# Patient Record
Sex: Male | Born: 1940 | Race: White | Hispanic: No | Marital: Married | Smoking: Never smoker
Health system: Southern US, Community
[De-identification: ages and names within clinical notes are randomized; demographics above are authoritative.]

## PROBLEM LIST (undated history)

## (undated) DIAGNOSIS — R0602 Shortness of breath: Secondary | ICD-10-CM

## (undated) DIAGNOSIS — R5383 Other fatigue: Secondary | ICD-10-CM

## (undated) DIAGNOSIS — E7800 Pure hypercholesterolemia, unspecified: Secondary | ICD-10-CM

## (undated) DIAGNOSIS — I10 Essential (primary) hypertension: Secondary | ICD-10-CM

## (undated) HISTORY — DX: Pure hypercholesterolemia, unspecified: E78.00

## (undated) HISTORY — PX: CHOLECYSTECTOMY: SHX55

## (undated) HISTORY — DX: Other fatigue: R53.83

## (undated) HISTORY — PX: REPLACEMENT TOTAL KNEE: SUR1224

## (undated) HISTORY — DX: Essential (primary) hypertension: I10

## (undated) HISTORY — DX: Shortness of breath: R06.02

## (undated) SURGERY — EGD (ESOPHAGOGASTRODUODENOSCOPY)
Anesthesia: Monitor Anesthesia Care

---

## 2015-06-09 DIAGNOSIS — E539 Vitamin B deficiency, unspecified: Secondary | ICD-10-CM | POA: Diagnosis not present

## 2015-06-09 DIAGNOSIS — E782 Mixed hyperlipidemia: Secondary | ICD-10-CM | POA: Diagnosis not present

## 2015-06-09 DIAGNOSIS — I10 Essential (primary) hypertension: Secondary | ICD-10-CM | POA: Diagnosis not present

## 2015-06-09 DIAGNOSIS — E559 Vitamin D deficiency, unspecified: Secondary | ICD-10-CM | POA: Diagnosis not present

## 2015-06-09 DIAGNOSIS — R7301 Impaired fasting glucose: Secondary | ICD-10-CM | POA: Diagnosis not present

## 2015-06-09 DIAGNOSIS — K219 Gastro-esophageal reflux disease without esophagitis: Secondary | ICD-10-CM | POA: Diagnosis not present

## 2015-06-17 DIAGNOSIS — N400 Enlarged prostate without lower urinary tract symptoms: Secondary | ICD-10-CM | POA: Diagnosis not present

## 2015-06-17 DIAGNOSIS — M1712 Unilateral primary osteoarthritis, left knee: Secondary | ICD-10-CM | POA: Diagnosis not present

## 2015-06-17 DIAGNOSIS — E782 Mixed hyperlipidemia: Secondary | ICD-10-CM | POA: Diagnosis not present

## 2015-06-17 DIAGNOSIS — E539 Vitamin B deficiency, unspecified: Secondary | ICD-10-CM | POA: Diagnosis not present

## 2015-06-17 DIAGNOSIS — J3089 Other allergic rhinitis: Secondary | ICD-10-CM | POA: Diagnosis not present

## 2015-06-17 DIAGNOSIS — Z6825 Body mass index (BMI) 25.0-25.9, adult: Secondary | ICD-10-CM | POA: Diagnosis not present

## 2015-06-17 DIAGNOSIS — K219 Gastro-esophageal reflux disease without esophagitis: Secondary | ICD-10-CM | POA: Diagnosis not present

## 2015-06-17 DIAGNOSIS — I10 Essential (primary) hypertension: Secondary | ICD-10-CM | POA: Diagnosis not present

## 2015-08-13 DIAGNOSIS — E539 Vitamin B deficiency, unspecified: Secondary | ICD-10-CM | POA: Diagnosis not present

## 2015-09-03 DIAGNOSIS — R972 Elevated prostate specific antigen [PSA]: Secondary | ICD-10-CM | POA: Diagnosis not present

## 2015-09-10 DIAGNOSIS — R39198 Other difficulties with micturition: Secondary | ICD-10-CM | POA: Diagnosis not present

## 2015-09-10 DIAGNOSIS — N5202 Corporo-venous occlusive erectile dysfunction: Secondary | ICD-10-CM | POA: Diagnosis not present

## 2015-10-06 DIAGNOSIS — L570 Actinic keratosis: Secondary | ICD-10-CM | POA: Diagnosis not present

## 2015-10-06 DIAGNOSIS — Z85828 Personal history of other malignant neoplasm of skin: Secondary | ICD-10-CM | POA: Diagnosis not present

## 2015-10-12 DIAGNOSIS — R112 Nausea with vomiting, unspecified: Secondary | ICD-10-CM | POA: Diagnosis not present

## 2015-10-12 DIAGNOSIS — E782 Mixed hyperlipidemia: Secondary | ICD-10-CM | POA: Diagnosis not present

## 2015-10-12 DIAGNOSIS — E539 Vitamin B deficiency, unspecified: Secondary | ICD-10-CM | POA: Diagnosis not present

## 2015-10-12 DIAGNOSIS — Z6825 Body mass index (BMI) 25.0-25.9, adult: Secondary | ICD-10-CM | POA: Diagnosis not present

## 2015-10-12 DIAGNOSIS — K219 Gastro-esophageal reflux disease without esophagitis: Secondary | ICD-10-CM | POA: Diagnosis not present

## 2015-10-12 DIAGNOSIS — I10 Essential (primary) hypertension: Secondary | ICD-10-CM | POA: Diagnosis not present

## 2015-10-12 DIAGNOSIS — R1013 Epigastric pain: Secondary | ICD-10-CM | POA: Diagnosis not present

## 2015-10-19 ENCOUNTER — Encounter (INDEPENDENT_AMBULATORY_CARE_PROVIDER_SITE_OTHER): Payer: Self-pay | Admitting: Internal Medicine

## 2015-10-28 ENCOUNTER — Encounter (INDEPENDENT_AMBULATORY_CARE_PROVIDER_SITE_OTHER): Payer: Self-pay | Admitting: Internal Medicine

## 2015-10-28 ENCOUNTER — Ambulatory Visit (INDEPENDENT_AMBULATORY_CARE_PROVIDER_SITE_OTHER): Payer: Self-pay | Admitting: Internal Medicine

## 2015-10-28 VITALS — BP 152/64 | HR 64 | Temp 97.7°F | Ht 73.0 in | Wt 190.3 lb

## 2015-10-28 DIAGNOSIS — E7800 Pure hypercholesterolemia, unspecified: Secondary | ICD-10-CM | POA: Insufficient documentation

## 2015-10-28 DIAGNOSIS — K219 Gastro-esophageal reflux disease without esophagitis: Secondary | ICD-10-CM

## 2015-10-28 DIAGNOSIS — I10 Essential (primary) hypertension: Secondary | ICD-10-CM | POA: Insufficient documentation

## 2015-10-28 DIAGNOSIS — R1310 Dysphagia, unspecified: Secondary | ICD-10-CM

## 2015-10-28 DIAGNOSIS — Z86010 Personal history of colonic polyps: Secondary | ICD-10-CM

## 2015-10-28 NOTE — Patient Instructions (Addendum)
EGD. The risks and benefits such as perforation, bleeding, and infection were reviewed with the patient and is agreeable. 

## 2015-10-28 NOTE — Progress Notes (Addendum)
   Subjective:    Patient ID: Travis Page, male    DOB: 11/17/1940, 75 y.o.   MRN: [id number]  HPI  Referred by Dr. Pleas Koch for epigastric pain, nausea and vomiting.   Healso tells me he has a colonoscopy every 3 yrs. His last colonoscopy was in 2014 with 3 polyps and all were tubular adenomas. He tells me he is having acid reflux "real bad". He says sometimes pills will lodge in his esophagus. He occasionally has food to lodge. He says he coughs up white mucous while he is eating. He says the Protonix helps sometimes. He says for the last 3 days his epigastric region rumbles. His appetite is good. He thinks he may have lost about 5 pounds. He c/o epigastric and slightly rt abdominal pain. He has had the pain about 3 days.  He says everything sours on his stomach. No NSAIDs except for a Baby ASA.  12/31/2012 Colonoscopy Dr. Truddie Hidden. with polypectomy cecum and hot snare polypectomy x 2 ascending colon:  No colonic neoplasms noted. Diverticula scattered in the sigmoid colon. Sessile polyp in cecum, ascending colon x 2 Biopsy: All polyps were tubular adenomas.      Review of Systems Past Medical History:  Diagnosis Date  . High cholesterol   . Hypertension     Past Surgical History:  Procedure Laterality Date  . CHOLECYSTECTOMY    . REPLACEMENT TOTAL KNEE     rt knee     No Known Allergies  No current outpatient prescriptions on file prior to visit.   No current facility-administered medications on file prior to visit.        Objective:   Physical Exam Blood pressure (!) 152/64, pulse 64, temperature 97.7 F (36.5 C), height 6\' 1"  (1.854 m), weight 190 lb 4.8 oz (86.3 kg).  Alert and oriented. Skin warm and dry. Oral mucosa is moist.   . Sclera anicteric, conjunctivae is pink. Thyroid not enlarged. No cervical lymphadenopathy. Lungs clear. Heart regular rate and rhythm.  Abdomen is soft. Bowel sounds are positive. No hepatomegaly. No abdominal masses felt. Epigastric  tenderness.  No edema to lower extremities.        Assessment & Plan:  GERD, not controlled at this time. Will try Dexilant and see how he does.  EGD/ED. Will esophagram on him and then scheduled EGD/ED.Marland Kitchen Colon polyps.  (tubular adenoma). Report says colonoscopy every 3 years.  Will discuss with Dr. Laural Golden if he needs a colonoscopy.

## 2015-10-29 ENCOUNTER — Encounter (INDEPENDENT_AMBULATORY_CARE_PROVIDER_SITE_OTHER): Payer: Self-pay

## 2015-11-02 DIAGNOSIS — R1310 Dysphagia, unspecified: Secondary | ICD-10-CM | POA: Diagnosis not present

## 2015-11-02 DIAGNOSIS — K224 Dyskinesia of esophagus: Secondary | ICD-10-CM | POA: Diagnosis not present

## 2015-11-02 DIAGNOSIS — K222 Esophageal obstruction: Secondary | ICD-10-CM | POA: Diagnosis not present

## 2015-11-04 ENCOUNTER — Telehealth (INDEPENDENT_AMBULATORY_CARE_PROVIDER_SITE_OTHER): Payer: Self-pay | Admitting: Internal Medicine

## 2015-11-04 ENCOUNTER — Encounter (INDEPENDENT_AMBULATORY_CARE_PROVIDER_SITE_OTHER): Payer: Self-pay

## 2015-11-04 ENCOUNTER — Encounter (INDEPENDENT_AMBULATORY_CARE_PROVIDER_SITE_OTHER): Payer: Self-pay | Admitting: *Deleted

## 2015-11-04 ENCOUNTER — Other Ambulatory Visit (INDEPENDENT_AMBULATORY_CARE_PROVIDER_SITE_OTHER): Payer: Self-pay | Admitting: Internal Medicine

## 2015-11-04 DIAGNOSIS — R1310 Dysphagia, unspecified: Secondary | ICD-10-CM | POA: Insufficient documentation

## 2015-11-04 NOTE — Telephone Encounter (Signed)
EGD/ED sch'd 12/10/15 at 300, patient aware, instructions mailed

## 2015-11-04 NOTE — Telephone Encounter (Signed)
Ann, EGD/ED. I have spoken with patient 

## 2015-11-04 NOTE — Telephone Encounter (Signed)
I have spoken with patient. His last colonoscopy was in 2014: tubular adenoma. All were small. Next colonoscopy in 2019. I spoke with patient and discussed with Dr. Laural Golden.

## 2015-12-10 ENCOUNTER — Encounter (HOSPITAL_COMMUNITY): Payer: Self-pay | Admitting: *Deleted

## 2015-12-10 ENCOUNTER — Ambulatory Visit (HOSPITAL_COMMUNITY)
Admission: RE | Admit: 2015-12-10 | Discharge: 2015-12-10 | Disposition: A | Discharge status: Home Health | Payer: Medicare Other | Source: Ambulatory Visit | Attending: Internal Medicine | Admitting: Internal Medicine

## 2015-12-10 ENCOUNTER — Encounter (HOSPITAL_COMMUNITY)
Admission: RE | Disposition: A | Discharge status: Home Health | Payer: Self-pay | Source: Ambulatory Visit | Attending: Internal Medicine

## 2015-12-10 DIAGNOSIS — K222 Esophageal obstruction: Secondary | ICD-10-CM | POA: Insufficient documentation

## 2015-12-10 DIAGNOSIS — R1310 Dysphagia, unspecified: Secondary | ICD-10-CM | POA: Diagnosis present

## 2015-12-10 DIAGNOSIS — I10 Essential (primary) hypertension: Secondary | ICD-10-CM | POA: Insufficient documentation

## 2015-12-10 DIAGNOSIS — F17290 Nicotine dependence, other tobacco product, uncomplicated: Secondary | ICD-10-CM | POA: Diagnosis not present

## 2015-12-10 DIAGNOSIS — Z79899 Other long term (current) drug therapy: Secondary | ICD-10-CM | POA: Diagnosis not present

## 2015-12-10 DIAGNOSIS — K449 Diaphragmatic hernia without obstruction or gangrene: Secondary | ICD-10-CM | POA: Diagnosis not present

## 2015-12-10 DIAGNOSIS — E7800 Pure hypercholesterolemia, unspecified: Secondary | ICD-10-CM | POA: Insufficient documentation

## 2015-12-10 DIAGNOSIS — Z7982 Long term (current) use of aspirin: Secondary | ICD-10-CM | POA: Insufficient documentation

## 2015-12-10 DIAGNOSIS — Z96651 Presence of right artificial knee joint: Secondary | ICD-10-CM | POA: Diagnosis not present

## 2015-12-10 DIAGNOSIS — R1013 Epigastric pain: Secondary | ICD-10-CM | POA: Diagnosis not present

## 2015-12-10 DIAGNOSIS — R1314 Dysphagia, pharyngoesophageal phase: Secondary | ICD-10-CM | POA: Diagnosis not present

## 2015-12-10 DIAGNOSIS — K210 Gastro-esophageal reflux disease with esophagitis: Secondary | ICD-10-CM | POA: Diagnosis not present

## 2015-12-10 DIAGNOSIS — K219 Gastro-esophageal reflux disease without esophagitis: Secondary | ICD-10-CM | POA: Diagnosis not present

## 2015-12-10 HISTORY — PX: ESOPHAGEAL DILATION: SHX303

## 2015-12-10 HISTORY — PX: ESOPHAGOGASTRODUODENOSCOPY: SHX5428

## 2015-12-10 SURGERY — EGD (ESOPHAGOGASTRODUODENOSCOPY)
Anesthesia: Moderate Sedation

## 2015-12-10 MED ORDER — BUTAMBEN-TETRACAINE-BENZOCAINE 2-2-14 % EX AERO
INHALATION_SPRAY | CUTANEOUS | Status: DC | PRN
  Administered 2015-12-10: 2 via TOPICAL

## 2015-12-10 MED ORDER — STERILE WATER FOR IRRIGATION IR SOLN
Status: DC | PRN
  Administered 2015-12-10: 2.5 mL

## 2015-12-10 MED ORDER — MIDAZOLAM HCL 5 MG/5ML IJ SOLN
INTRAMUSCULAR | Status: AC
  Filled 2015-12-10: qty 10

## 2015-12-10 MED ORDER — MEPERIDINE HCL 50 MG/ML IJ SOLN
INTRAMUSCULAR | Status: DC | PRN
  Administered 2015-12-10 (×2): 25 mg via INTRAVENOUS

## 2015-12-10 MED ORDER — MEPERIDINE HCL 50 MG/ML IJ SOLN
INTRAMUSCULAR | Status: AC
  Filled 2015-12-10: qty 1

## 2015-12-10 MED ORDER — SODIUM CHLORIDE 0.9 % IV SOLN
INTRAVENOUS | Status: DC
  Administered 2015-12-10: 15:00:00 via INTRAVENOUS

## 2015-12-10 MED ORDER — MIDAZOLAM HCL 5 MG/5ML IJ SOLN
INTRAMUSCULAR | Status: DC | PRN
  Administered 2015-12-10 (×3): 2 mg via INTRAVENOUS
  Administered 2015-12-10 (×2): 1 mg via INTRAVENOUS

## 2015-12-10 NOTE — Discharge Instructions (Signed)
Resume aspirin on 12/14/2015. Resume other medications as before. Resume usual diet. No driving for 24 hours. Please call office with progress report next week.   Gastrointestinal Endoscopy, Care After Refer to this sheet in the next few weeks. These instructions provide you with information on caring for yourself after your procedure. Your caregiver may also give you more specific instructions. Your treatment has been planned according to current medical practices, but problems sometimes occur. Call your caregiver if you have any problems or questions after your procedure. HOME CARE INSTRUCTIONS  If you were given medicine to help you relax (sedative), do not drive, operate machinery, or sign important documents for 24 hours.  Avoid alcohol and hot or warm beverages for the first 24 hours after the procedure.  Only take over-the-counter or prescription medicines for pain, discomfort, or fever as directed by your caregiver. You may resume taking your normal medicines unless your caregiver tells you otherwise. Ask your caregiver when you may resume taking medicines that may cause bleeding, such as aspirin, clopidogrel, or warfarin.  You may return to your normal diet and activities on the day after your procedure, or as directed by your caregiver. Walking may help to reduce any bloated feeling in your abdomen.  Drink enough fluids to keep your urine clear or pale yellow.  You may gargle with salt water if you have a sore throat. SEEK IMMEDIATE MEDICAL CARE IF:  You have severe nausea or vomiting.  You have severe abdominal pain, abdominal cramps that last longer than 6 hours, or abdominal swelling (distention).  You have severe shoulder or back pain.  You have trouble swallowing.  You have shortness of breath, your breathing is shallow, or you are breathing faster than normal.  You have a fever or a rapid heartbeat.  You vomit blood or material that looks like coffee  grounds.  You have bloody, black, or tarry stools. MAKE SURE YOU:  Understand these instructions.  Will watch your condition.  Will get help right away if you are not doing well or get worse.   This information is not intended to replace advice given to you by your health care provider. Make sure you discuss any questions you have with your health care provider.

## 2015-12-10 NOTE — H&P (Signed)
Kier Hergenreder is an 75 y.o. male.   Chief Complaint: Patient is here for EGD and ED. HPI: Patient is 75 year old Caucasian male who was history of GERD for about 20 years. He presents with 3-4 year history of dysphagia to solids particularly and bread. He points to suprasternal area side of bolus obstruction. Bolus eventually passes down. He's not had any episode of food impaction. Also complains of intermittent epigastric pain which occurs when he eats certain foods. He denies nausea vomiting melena or frank rectal bleeding. His appetite is good and he has not lost any weight. He notices blood in the tissues sometimes felt to be due to hemorrhoids.  Past Medical History:  Diagnosis Date  . High cholesterol   . Hypertension         GERD.       Right knee pain.  Past Surgical History:  Procedure Laterality Date  . CHOLECYSTECTOMY    . REPLACEMENT TOTAL KNEE     rt knee     History reviewed. No pertinent family history. Social History:  reports that he has never smoked. He uses smokeless tobacco. He reports that he does not drink alcohol or use drugs.  Allergies:  Allergies  Allergen Reactions  . Prednisone     Chest pain. Can take Decadron per records  . Statins     Muscle aches.    Medications Prior to Admission  Medication Sig Dispense Refill  . dutasteride (AVODART) 0.5 MG capsule Take 0.5 mg by mouth daily.    . fenofibrate 160 MG tablet Take 160 mg by mouth daily.    Marland Kitchen HYDROcodone-acetaminophen (NORCO) 10-325 MG tablet Take 1 tablet by mouth every 6 (six) hours as needed.    Marland Kitchen losartan-hydrochlorothiazide (HYZAAR) 50-12.5 MG tablet Take 1 tablet by mouth daily.    . pantoprazole (PROTONIX) 40 MG tablet Take 40 mg by mouth daily.    Marland Kitchen zolpidem (AMBIEN) 10 MG tablet Take 10 mg by mouth at bedtime.    Marland Kitchen aspirin 81 MG tablet Take 81 mg by mouth daily.      No results found for this or any previous visit (from the past 48 hour(s)). No results found.  ROS  Blood pressure (!)  152/66, pulse (!) 46, temperature 98.4 F (36.9 C), temperature source Oral, resp. rate (!) 8, height 6\' 1"  (1.854 m), weight 193 lb (87.5 kg), SpO2 95 %. Physical Exam  Constitutional: He appears well-developed and well-nourished.  HENT:  Mouth/Throat: Oropharynx is clear and moist.  Patient is edentulous but has dentures.  Eyes: Conjunctivae are normal. No scleral icterus.  Neck: No thyromegaly present.  Cardiovascular: Normal rate, regular rhythm and normal heart sounds.   No murmur heard. Respiratory: Effort normal and breath sounds normal.  GI: Soft. He exhibits no distension and no mass. There is no tenderness.  Musculoskeletal: He exhibits no edema.  Lymphadenopathy:    He has no cervical adenopathy.  Neurological: He is alert.  Skin: Skin is warm and dry.     Assessment/Plan Solid food dysphagia in patient with chronic GERD. Epigastric pain. EGD with ED.  Hildred Laser, MD 12/10/2015, 3:26 PM

## 2015-12-10 NOTE — Op Note (Addendum)
[facility] Patient Name: Travis Page Procedure Date: 12/10/2015 3:25 PM MRN: [id number] Date of Birth: 09/07/1940 Attending MD: Hildred Laser , MD CSN: [id number] Age: 75 Admit Type: Outpatient Procedure:                Upper GI endoscopy Indications:              Esophageal dysphagia, Reflux esophagitis Providers:                Hildred Laser, MD, Lurline Del, RN, Purcell Nails. [place],                            Technician Referring MD:             Curlene Labrum, MD Medicines:                Cetacaine spray, Meperidine 50 mg IV, Midazolam 8                            mg IV Complications:            No immediate complications. Estimated Blood Loss:     Estimated blood loss was minimal. Procedure:                Pre-Anesthesia Assessment:                           - Prior to the procedure, a History and Physical                            was performed, and patient medications and                            allergies were reviewed. The patient's tolerance of                            previous anesthesia was also reviewed. The risks                            and benefits of the procedure and the sedation                            options and risks were discussed with the patient.                            All questions were answered, and informed consent                            was obtained. Prior Anticoagulants: The patient                            last took aspirin 3 days prior to the procedure.                            ASA Grade Assessment: II - A patient with mild  systemic disease. After reviewing the risks and                            benefits, the patient was deemed in satisfactory                            condition to undergo the procedure.                           After obtaining informed consent, the endoscope was                            passed under direct vision. Throughout the                            procedure, the  patient's blood pressure, pulse, and                            oxygen saturations were monitored continuously. The                            [id number]) scope was introduced through the                            mouth, and advanced to the second part of duodenum.                            The [id number]) scope was introduced through                            the mouth, and advanced to the. The upper GI                            endoscopy was technically difficult and complex due                            to unusual anatomy. Successful completion of the                            procedure was aided by withdrawing the scope and                            replacing with the pediatric endoscope. The patient                            tolerated the procedure well. Scope In: 3:36:51 PM Scope Out: 4:23:38 PM Total Procedure Duration: 0 hours 46 minutes 47 seconds  Findings:      A moderate Schatzki ring (acquired) was found at the gastroesophageal       junction. The scope was withdrawn. Dilation was performed with a Maloney       dilator with no resistance at 48 Fr. The dilation site was examined       mucosal disruption and showed moderate improvement in luminal narrowing.  The Z-line was found 38 cm from the incisors.      A 2 cm hiatal hernia was present.      The entire examined stomach was normal.      The duodenal bulb and second portion of the duodenum were normal. Impression:               - Moderate Schatzki ring. Dilated n disrupted by                            passing 54 French Maloney dilator.                           - Z-line, 38 cm from the incisors.                           - 2 cm hiatal hernia.                           - Normal stomach.                           - Normal duodenal bulb and second portion of the                            duodenum.                           - No specimens collected.                           - Comment: long  procedure time because of equipment                            malfunction. Moderate Sedation:      Moderate (conscious) sedation was administered by the endoscopy nurse       and supervised by the endoscopist. The following parameters were       monitored: oxygen saturation, heart rate, blood pressure, CO2       capnography and response to care. Total physician intraservice time was       47 minutes. Recommendation:           - Patient has a contact number available for                            emergencies. The signs and symptoms of potential                            delayed complications were discussed with the                            patient. Return to normal activities tomorrow.                            Written discharge instructions were provided to the  patient.                           - Patient has a contact number available for                            emergencies. The signs and symptoms of potential                            delayed complications were discussed with the                            patient. Return to normal activities tomorrow.                            Written discharge instructions were provided to the                            patient.                           - Resume previous diet today.                           - Continue present medications.                           - Resume aspirin at prior dose 12/14/2015.                           - Telephone GI clinic next week for progress report. Procedure Code(s):        --- Professional ---                           [phone], Esophagogastroduodenoscopy, flexible,                            transoral; diagnostic, including collection of                            specimen(s) by brushing or washing, when performed                            (separate procedure)                           43450, Dilation of esophagus, by unguided sound or                            bougie,  single or multiple passes                           99152, Moderate sedation services provided by the                            same physician or other qualified health care  professional performing the diagnostic or                            therapeutic service that the sedation supports,                            requiring the presence of an independent trained                            observer to assist in the monitoring of the                            patient's level of consciousness and physiological                            status; initial 15 minutes of intraservice time,                            patient age 65 years or older                           [phone], Moderate sedation services; each additional                            15 minutes intraservice time                           [phone], Moderate sedation services; each additional                            15 minutes intraservice time Diagnosis Code(s):        --- Professional ---                           K22.2, Esophageal obstruction                           K44.9, Diaphragmatic hernia without obstruction or                            gangrene                           R13.14, Dysphagia, pharyngoesophageal phase                           K21.0, Gastro-esophageal reflux disease with                            esophagitis CPT copyright 2016 [employer]. All rights reserved. The codes documented in this report are preliminary and upon coder review may  be revised to meet current compliance requirements. Hildred Laser, MD Hildred Laser, MD 12/10/2015 4:41:12 PM This report has been signed electronically. Number of Addenda: 0

## 2015-12-21 ENCOUNTER — Encounter (HOSPITAL_COMMUNITY): Payer: Self-pay | Admitting: Internal Medicine

## 2015-12-25 DIAGNOSIS — Z0001 Encounter for general adult medical examination with abnormal findings: Secondary | ICD-10-CM | POA: Diagnosis not present

## 2015-12-28 DIAGNOSIS — H524 Presbyopia: Secondary | ICD-10-CM | POA: Diagnosis not present

## 2015-12-28 DIAGNOSIS — Z961 Presence of intraocular lens: Secondary | ICD-10-CM | POA: Diagnosis not present

## 2015-12-29 DIAGNOSIS — I10 Essential (primary) hypertension: Secondary | ICD-10-CM | POA: Diagnosis not present

## 2015-12-29 DIAGNOSIS — Z0001 Encounter for general adult medical examination with abnormal findings: Secondary | ICD-10-CM | POA: Diagnosis not present

## 2015-12-29 DIAGNOSIS — Z6826 Body mass index (BMI) 26.0-26.9, adult: Secondary | ICD-10-CM | POA: Diagnosis not present

## 2015-12-29 DIAGNOSIS — N400 Enlarged prostate without lower urinary tract symptoms: Secondary | ICD-10-CM | POA: Diagnosis not present

## 2015-12-29 DIAGNOSIS — K219 Gastro-esophageal reflux disease without esophagitis: Secondary | ICD-10-CM | POA: Diagnosis not present

## 2015-12-29 DIAGNOSIS — D519 Vitamin B12 deficiency anemia, unspecified: Secondary | ICD-10-CM | POA: Diagnosis not present

## 2015-12-29 DIAGNOSIS — Z23 Encounter for immunization: Secondary | ICD-10-CM | POA: Diagnosis not present

## 2015-12-29 DIAGNOSIS — E782 Mixed hyperlipidemia: Secondary | ICD-10-CM | POA: Diagnosis not present

## 2016-02-04 DIAGNOSIS — E539 Vitamin B deficiency, unspecified: Secondary | ICD-10-CM | POA: Diagnosis not present

## 2016-02-18 DIAGNOSIS — Z6827 Body mass index (BMI) 27.0-27.9, adult: Secondary | ICD-10-CM | POA: Diagnosis not present

## 2016-02-18 DIAGNOSIS — J209 Acute bronchitis, unspecified: Secondary | ICD-10-CM | POA: Diagnosis not present

## 2016-02-18 DIAGNOSIS — J3089 Other allergic rhinitis: Secondary | ICD-10-CM | POA: Diagnosis not present

## 2016-02-18 DIAGNOSIS — I10 Essential (primary) hypertension: Secondary | ICD-10-CM | POA: Diagnosis not present

## 2016-02-18 DIAGNOSIS — R5381 Other malaise: Secondary | ICD-10-CM | POA: Diagnosis not present

## 2016-03-07 DIAGNOSIS — E539 Vitamin B deficiency, unspecified: Secondary | ICD-10-CM | POA: Diagnosis not present

## 2016-04-08 DIAGNOSIS — D519 Vitamin B12 deficiency anemia, unspecified: Secondary | ICD-10-CM | POA: Diagnosis not present

## 2016-06-02 DIAGNOSIS — E539 Vitamin B deficiency, unspecified: Secondary | ICD-10-CM | POA: Diagnosis not present

## 2016-06-16 DIAGNOSIS — Z6827 Body mass index (BMI) 27.0-27.9, adult: Secondary | ICD-10-CM | POA: Diagnosis not present

## 2016-06-16 DIAGNOSIS — K130 Diseases of lips: Secondary | ICD-10-CM | POA: Diagnosis not present

## 2016-06-16 DIAGNOSIS — J209 Acute bronchitis, unspecified: Secondary | ICD-10-CM | POA: Diagnosis not present

## 2016-06-28 DIAGNOSIS — E782 Mixed hyperlipidemia: Secondary | ICD-10-CM | POA: Diagnosis not present

## 2016-06-28 DIAGNOSIS — I10 Essential (primary) hypertension: Secondary | ICD-10-CM | POA: Diagnosis not present

## 2016-06-30 DIAGNOSIS — I10 Essential (primary) hypertension: Secondary | ICD-10-CM | POA: Diagnosis not present

## 2016-06-30 DIAGNOSIS — J320 Chronic maxillary sinusitis: Secondary | ICD-10-CM | POA: Diagnosis not present

## 2016-06-30 DIAGNOSIS — H8113 Benign paroxysmal vertigo, bilateral: Secondary | ICD-10-CM | POA: Diagnosis not present

## 2016-06-30 DIAGNOSIS — E782 Mixed hyperlipidemia: Secondary | ICD-10-CM | POA: Diagnosis not present

## 2016-07-04 DIAGNOSIS — E539 Vitamin B deficiency, unspecified: Secondary | ICD-10-CM | POA: Diagnosis not present

## 2016-07-04 DIAGNOSIS — J329 Chronic sinusitis, unspecified: Secondary | ICD-10-CM | POA: Diagnosis not present

## 2016-08-03 DIAGNOSIS — E539 Vitamin B deficiency, unspecified: Secondary | ICD-10-CM | POA: Diagnosis not present

## 2016-09-05 DIAGNOSIS — E539 Vitamin B deficiency, unspecified: Secondary | ICD-10-CM | POA: Diagnosis not present

## 2016-10-04 DIAGNOSIS — L570 Actinic keratosis: Secondary | ICD-10-CM | POA: Diagnosis not present

## 2016-10-06 DIAGNOSIS — I10 Essential (primary) hypertension: Secondary | ICD-10-CM | POA: Diagnosis not present

## 2016-10-06 DIAGNOSIS — N644 Mastodynia: Secondary | ICD-10-CM | POA: Diagnosis not present

## 2016-10-06 DIAGNOSIS — D519 Vitamin B12 deficiency anemia, unspecified: Secondary | ICD-10-CM | POA: Diagnosis not present

## 2016-10-06 DIAGNOSIS — Z6826 Body mass index (BMI) 26.0-26.9, adult: Secondary | ICD-10-CM | POA: Diagnosis not present

## 2016-10-12 DIAGNOSIS — R928 Other abnormal and inconclusive findings on diagnostic imaging of breast: Secondary | ICD-10-CM | POA: Diagnosis not present

## 2016-11-07 DIAGNOSIS — E782 Mixed hyperlipidemia: Secondary | ICD-10-CM | POA: Diagnosis not present

## 2016-11-07 DIAGNOSIS — R5381 Other malaise: Secondary | ICD-10-CM | POA: Diagnosis not present

## 2016-11-07 DIAGNOSIS — D519 Vitamin B12 deficiency anemia, unspecified: Secondary | ICD-10-CM | POA: Diagnosis not present

## 2016-11-07 DIAGNOSIS — R0789 Other chest pain: Secondary | ICD-10-CM | POA: Diagnosis not present

## 2016-11-18 DIAGNOSIS — R079 Chest pain, unspecified: Secondary | ICD-10-CM | POA: Diagnosis not present

## 2016-11-30 ENCOUNTER — Telehealth: Payer: Self-pay | Admitting: Cardiology

## 2016-11-30 ENCOUNTER — Encounter: Payer: Self-pay | Admitting: Cardiology

## 2016-11-30 ENCOUNTER — Ambulatory Visit (INDEPENDENT_AMBULATORY_CARE_PROVIDER_SITE_OTHER): Payer: Medicare Other | Admitting: Cardiology

## 2016-11-30 ENCOUNTER — Encounter: Payer: Self-pay | Admitting: *Deleted

## 2016-11-30 VITALS — BP 122/70 | HR 75 | Ht 73.0 in | Wt 201.0 lb

## 2016-11-30 DIAGNOSIS — R9439 Abnormal result of other cardiovascular function study: Secondary | ICD-10-CM | POA: Diagnosis not present

## 2016-11-30 DIAGNOSIS — R0789 Other chest pain: Secondary | ICD-10-CM | POA: Diagnosis not present

## 2016-11-30 MED ORDER — NITROGLYCERIN 0.4 MG SL SUBL: 0.4000 mg | SUBLINGUAL_TABLET | SUBLINGUAL | 3 refills | Status: DC | PRN

## 2016-11-30 NOTE — Patient Instructions (Signed)
Your physician recommends that you schedule a follow-up appointment in: Travis Page recommends that you continue on your current medications as directed. Please refer to the Current Medication list given to you today.  Your physician has requested that you have a cardiac catheterization. Cardiac catheterization is used to diagnose and/or treat various heart conditions. Doctors may recommend this procedure for a number of different reasons. The most common reason is to evaluate chest pain. Chest pain can be a symptom of coronary artery disease (CAD), and cardiac catheterization can show whether plaque is narrowing or blocking your heart's arteries. This procedure is also used to evaluate the valves, as well as measure the blood flow and oxygen levels in different parts of your heart. For further information please visit [email or web address]. Please follow instruction sheet, as given.  Nitroglycerin sublingual tablets What is this medicine? NITROGLYCERIN (nye troe GLI ser in) is a type of vasodilator. It relaxes blood vessels, increasing the blood and oxygen supply to your heart. This medicine is used to relieve chest pain caused by angina. It is also used to prevent chest pain before activities like climbing stairs, going outdoors in cold weather, or sexual activity. This medicine may be used for other purposes; ask your health care provider or pharmacist if you have questions. COMMON BRAND NAME(S): Nitroquick, Nitrostat, Nitrotab What should I tell my health care provider before I take this medicine? They need to know if you have any of these conditions: -anemia -head injury, recent stroke, or bleeding in the brain -liver disease -previous heart attack -an unusual or allergic reaction to nitroglycerin, other medicines, foods, dyes, or preservatives -pregnant or trying to get pregnant -breast-feeding How should I use this medicine? Take this medicine by mouth as needed.  At the first sign of an angina attack (chest pain or tightness) place one tablet under your tongue. You can also take this medicine 5 to 10 minutes before an event likely to produce chest pain. Follow the directions on the prescription label. Let the tablet dissolve under the tongue. Do not swallow whole. Replace the dose if you accidentally swallow it. It will help if your mouth is not dry. Saliva around the tablet will help it to dissolve more quickly. Do not eat or drink, smoke or chew tobacco while a tablet is dissolving. If you are not better within 5 minutes after taking ONE dose of nitroglycerin, call 9-1-1 immediately to seek emergency medical care. Do not take more than 3 nitroglycerin tablets over 15 minutes. If you take this medicine often to relieve symptoms of angina, your doctor or health care professional may provide you with different instructions to manage your symptoms. If symptoms do not go away after following these instructions, it is important to call 9-1-1 immediately. Do not take more than 3 nitroglycerin tablets over 15 minutes. Talk to your pediatrician regarding the use of this medicine in children. Special care may be needed. Overdosage: If you think you have taken too much of this medicine contact a poison control center or emergency room at once. NOTE: This medicine is only for you. Do not share this medicine with others. What if I miss a dose? This does not apply. This medicine is only used as needed. What may interact with this medicine? Do not take this medicine with any of the following medications: -certain migraine medicines like ergotamine and dihydroergotamine (DHE) -medicines used to treat erectile dysfunction like sildenafil, tadalafil, and vardenafil -riociguat This medicine may also  interact with the following medications: -alteplase -aspirin -heparin -medicines for high blood pressure -medicines for mental depression -other medicines used to treat  angina -phenothiazines like chlorpromazine, mesoridazine, prochlorperazine, thioridazine This list may not describe all possible interactions. Give your health care provider a list of all the medicines, herbs, non-prescription drugs, or dietary supplements you use. Also tell them if you smoke, drink alcohol, or use illegal drugs. Some items may interact with your medicine. What should I watch for while using this medicine? Tell your doctor or health care professional if you feel your medicine is no longer working. Keep this medicine with you at all times. Sit or lie down when you take your medicine to prevent falling if you feel dizzy or faint after using it. Try to remain calm. This will help you to feel better faster. If you feel dizzy, take several deep breaths and lie down with your feet propped up, or bend forward with your head resting between your knees. You may get drowsy or dizzy. Do not drive, use machinery, or do anything that needs mental alertness until you know how this drug affects you. Do not stand or sit up quickly, especially if you are an older patient. This reduces the risk of dizzy or fainting spells. Alcohol can make you more drowsy and dizzy. Avoid alcoholic drinks. Do not treat yourself for coughs, colds, or pain while you are taking this medicine without asking your doctor or health care professional for advice. Some ingredients may increase your blood pressure. What side effects may I notice from receiving this medicine? Side effects that you should report to your doctor or health care professional as soon as possible: -blurred vision -dry mouth -skin rash -sweating -the feeling of extreme pressure in the head -unusually weak or tired Side effects that usually do not require medical attention (report to your doctor or health care professional if they continue or are bothersome): -flushing of the face or neck -headache -irregular heartbeat, palpitations -nausea,  vomiting This list may not describe all possible side effects. Call your doctor for medical advice about side effects. You may report side effects to FDA at 1-800-FDA-1088. Where should I keep my medicine? Keep out of the reach of children. Store at room temperature between 20 and 25 degrees C (68 and 77 degrees F). Store in [facility] [employer]. Protect from light and moisture. Keep tightly closed. Throw away any unused medicine after the expiration date. NOTE: This sheet is a summary. It may not cover all possible information. If you have questions about this medicine, talk to your doctor, pharmacist, or health care provider.  2018 Elsevier/Gold Standard (2013-01-03 17:57:36)

## 2016-11-30 NOTE — Progress Notes (Signed)
Clinical Summary Mr. Parekh is a 76 y.o.male seen as a new consult for chest pain, referred by Dr Gae Dry.   1. Chest pain - left sided chest pain sharp pain, lasted about 30 seconds. 10/10 in severity, can occur at rest or with activity. No other associated symptoms. EPisodes occurs about every 2 weeks - can have some SOB/DOE with his yardworks which is new, gets severely diaphoretic - 10/2016 nuclear stress: moderate inferior ischemia, LVEF 54%, intermediate risk   CAD risk factors: HTN, HL, father MI, mother MI, sister with stents and CABG.   Past Medical History:  Diagnosis Date  . Fatigue   . High cholesterol   . Hypertension   . Shortness of breath      Allergies  Allergen Reactions  . Prednisone     Chest pain. Can take Decadron per records  . Statins     Muscle aches.     Current Outpatient Prescriptions  Medication Sig Dispense Refill  . aspirin 81 MG tablet Take 1 tablet (81 mg total) by mouth daily. 30 tablet   . cyclobenzaprine (FLEXERIL) 10 MG tablet Take 10 mg by mouth 3 (three) times daily as needed for muscle spasms.    Marland Kitchen dutasteride (AVODART) 0.5 MG capsule Take 0.5 mg by mouth daily.    . fenofibrate 160 MG tablet Take 160 mg by mouth daily.    Marland Kitchen HYDROcodone-acetaminophen (NORCO) 10-325 MG tablet Take by mouth.    Marland Kitchen HYDROcodone-homatropine (HYCODAN) 5-1.5 MG/5ML syrup Take 5 mLs by mouth every 6 (six) hours as needed for cough.    . losartan (COZAAR) 50 MG tablet Take 50 mg by mouth daily.    . meclizine (ANTIVERT) 25 MG tablet Take 25 mg by mouth 3 (three) times daily as needed for dizziness.    . pantoprazole (PROTONIX) 40 MG tablet Take 40 mg by mouth daily.    . promethazine (PHENERGAN) 25 MG tablet Take 25 mg by mouth every 6 (six) hours as needed for nausea or vomiting.    Marland Kitchen zolpidem (AMBIEN) 10 MG tablet Take 10 mg by mouth at bedtime.     No current facility-administered medications for this visit.      Past Surgical History:    Procedure Laterality Date  . CHOLECYSTECTOMY    . ESOPHAGEAL DILATION N/A 12/10/2015   Procedure: ESOPHAGEAL DILATION;  Surgeon: Rogene Houston, MD;  Location: [facility];  Service: Endoscopy;  Laterality: N/A;  . ESOPHAGOGASTRODUODENOSCOPY N/A 12/10/2015   Procedure: ESOPHAGOGASTRODUODENOSCOPY (EGD);  Surgeon: Rogene Houston, MD;  Location: [facility];  Service: Endoscopy;  Laterality: N/A;  . REPLACEMENT TOTAL KNEE     rt knee      Allergies  Allergen Reactions  . Prednisone     Chest pain. Can take Decadron per records  . Statins     Muscle aches.      Family History  Problem Relation Age of Onset  . Heart attack Mother   . Hypercholesterolemia Mother   . Heart attack Father   . Hypercholesterolemia Sister   . CAD Sister      Social History Mr. Alessio reports that he has never smoked. He uses smokeless tobacco. Mr. Fleming reports that he does not drink alcohol.   Review of Systems CONSTITUTIONAL: No weight loss, fever, chills, weakness or fatigue.  HEENT: Eyes: No visual loss, blurred vision, double vision or yellow sclerae.No hearing loss, sneezing, congestion, runny nose or sore throat.  SKIN: No rash or  itching.  CARDIOVASCULAR: per hpi RESPIRATORY: per hpi GASTROINTESTINAL: No anorexia, nausea, vomiting or diarrhea. No abdominal pain or blood.  GENITOURINARY: No burning on urination, no polyuria NEUROLOGICAL: No headache, dizziness, syncope, paralysis, ataxia, numbness or tingling in the extremities. No change in bowel or bladder control.  MUSCULOSKELETAL: No muscle, back pain, joint pain or stiffness.  LYMPHATICS: No enlarged nodes. No history of splenectomy.  PSYCHIATRIC: No history of depression or anxiety.  ENDOCRINOLOGIC: No reports of sweating, cold or heat intolerance. No polyuria or polydipsia.  Marland Kitchen   Physical Examination Vitals:   11/30/16 1339  BP: 122/70  Pulse: 75  SpO2: 96%   Vitals:   11/30/16 1339  Weight: 201 lb (91.2 kg)   Height: 6\' 1"  (1.854 m)    Gen: resting comfortably, no acute distress HEENT: no scleral icterus, pupils equal round and reactive, no palptable cervical adenopathy,  CV: RRR, no m/r/g, no jvd Resp: Clear to auscultation bilaterally GI: abdomen is soft, non-tender, non-distended, normal bowel sounds, no hepatosplenomegaly MSK: extremities are warm, no edema.  Skin: warm, no rash Neuro:  no focal deficits Psych: appropriate affect     Assessment and Plan   1. Chest pain - recent chest pain symptoms in patient with multiple CAD risk factors and recent abnormal nuclear stress test showing moderate inferior ischemia - we will plan for cardiac cath.  - Given Rx for SL NG   I have reviewed the risks, indications, and alternatives to cardiac catheterization, possible angioplasty, and stenting with the patient  today. Risks include but are not limited to bleeding, infection, vascular injury, stroke, myocardial infection, arrhythmia, kidney injury, radiation-related injury in the case of prolonged fluoroscopy use, emergency cardiac surgery, and death. The patient understands the risks of serious complication is 1-2 in 4656 with diagnostic cardiac cath and 1-2% or less with angioplasty/stenting.   F/u 1 month   Arnoldo Lenis, M.D., F.A.C.C.

## 2016-11-30 NOTE — Telephone Encounter (Signed)
Pre-cert Verification for the following procedure   [facility] 12/07/16 @ 8:30AM VARANASI

## 2016-12-05 ENCOUNTER — Telehealth: Payer: Self-pay

## 2016-12-05 NOTE — Telephone Encounter (Signed)
Spoke with wife per DPR Patient contacted pre-catheterization at [facility] scheduled for:  12/07/2016 @ 0830 Verified arrival time and place:  [facility] @ 0630 Confirmed AM meds to be taken pre-cath with sip of water: Take ASA Hold losartan/hctz Confirmed patient has responsible person to drive home post procedure and observe patient for 24 hours:  yes

## 2016-12-07 ENCOUNTER — Encounter (HOSPITAL_COMMUNITY)
Admission: RE | Disposition: A | Discharge status: Home / Self Care | Payer: Self-pay | Source: Ambulatory Visit | Attending: Interventional Cardiology

## 2016-12-07 ENCOUNTER — Ambulatory Visit (HOSPITAL_COMMUNITY)
Admission: RE | Admit: 2016-12-07 | Discharge: 2016-12-07 | Disposition: A | Discharge status: Home / Self Care | Payer: Medicare Other | Source: Ambulatory Visit | Attending: Interventional Cardiology | Admitting: Interventional Cardiology

## 2016-12-07 DIAGNOSIS — R0789 Other chest pain: Secondary | ICD-10-CM

## 2016-12-07 DIAGNOSIS — I739 Peripheral vascular disease, unspecified: Secondary | ICD-10-CM | POA: Diagnosis not present

## 2016-12-07 DIAGNOSIS — Z888 Allergy status to other drugs, medicaments and biological substances status: Secondary | ICD-10-CM | POA: Diagnosis not present

## 2016-12-07 DIAGNOSIS — R9439 Abnormal result of other cardiovascular function study: Secondary | ICD-10-CM

## 2016-12-07 DIAGNOSIS — R079 Chest pain, unspecified: Secondary | ICD-10-CM | POA: Diagnosis present

## 2016-12-07 DIAGNOSIS — R0602 Shortness of breath: Secondary | ICD-10-CM | POA: Diagnosis not present

## 2016-12-07 DIAGNOSIS — E7800 Pure hypercholesterolemia, unspecified: Secondary | ICD-10-CM | POA: Diagnosis not present

## 2016-12-07 DIAGNOSIS — I2510 Atherosclerotic heart disease of native coronary artery without angina pectoris: Secondary | ICD-10-CM | POA: Insufficient documentation

## 2016-12-07 DIAGNOSIS — Z96652 Presence of left artificial knee joint: Secondary | ICD-10-CM | POA: Insufficient documentation

## 2016-12-07 DIAGNOSIS — Z720 Tobacco use: Secondary | ICD-10-CM | POA: Insufficient documentation

## 2016-12-07 DIAGNOSIS — Z9049 Acquired absence of other specified parts of digestive tract: Secondary | ICD-10-CM | POA: Insufficient documentation

## 2016-12-07 DIAGNOSIS — Z8249 Family history of ischemic heart disease and other diseases of the circulatory system: Secondary | ICD-10-CM | POA: Insufficient documentation

## 2016-12-07 DIAGNOSIS — Z7982 Long term (current) use of aspirin: Secondary | ICD-10-CM | POA: Insufficient documentation

## 2016-12-07 DIAGNOSIS — Z79899 Other long term (current) drug therapy: Secondary | ICD-10-CM | POA: Diagnosis not present

## 2016-12-07 DIAGNOSIS — I10 Essential (primary) hypertension: Secondary | ICD-10-CM | POA: Insufficient documentation

## 2016-12-07 HISTORY — PX: LEFT HEART CATH AND CORONARY ANGIOGRAPHY: CATH118249

## 2016-12-07 LAB — PROTIME-INR
INR: 1.01
PROTHROMBIN TIME: 13.2 s (ref 11.4–15.2)

## 2016-12-07 LAB — BASIC METABOLIC PANEL
Anion gap: 7 (ref 5–15)
BUN: 15 mg/dL (ref 6–20)
CALCIUM: 9.6 mg/dL (ref 8.9–10.3)
CHLORIDE: 105 mmol/L (ref 101–111)
CO2: 25 mmol/L (ref 22–32)
CREATININE: 1.29 mg/dL — AB (ref 0.61–1.24)
GFR, EST NON AFRICAN AMERICAN: 52 mL/min — AB (ref >60)
Glucose, Bld: 121 mg/dL — ABNORMAL HIGH (ref 65–99)
Potassium: 3.7 mmol/L (ref 3.5–5.1)
SODIUM: 137 mmol/L (ref 135–145)

## 2016-12-07 LAB — CBC
HCT: 38.8 % — ABNORMAL LOW (ref 39.0–52.0)
Hemoglobin: 12.9 g/dL — ABNORMAL LOW (ref 13.0–17.0)
MCH: 28.3 pg (ref 26.0–34.0)
MCHC: 33.2 g/dL (ref 30.0–36.0)
MCV: 85.1 fL (ref 78.0–100.0)
PLATELETS: 278 10*3/uL (ref 150–400)
RBC: 4.56 MIL/uL (ref 4.22–5.81)
RDW: 13.7 % (ref 11.5–15.5)
WBC: 5.8 10*3/uL (ref 4.0–10.5)

## 2016-12-07 SURGERY — LEFT HEART CATH AND CORONARY ANGIOGRAPHY
Anesthesia: LOCAL

## 2016-12-07 MED ORDER — SODIUM CHLORIDE 0.9% FLUSH: 3.0000 mL | INTRAVENOUS | Status: DC | PRN

## 2016-12-07 MED ORDER — SODIUM CHLORIDE 0.9 % IV SOLN: INTRAVENOUS | Status: AC

## 2016-12-07 MED ORDER — SODIUM CHLORIDE 0.9% FLUSH: 3.0000 mL | Freq: Two times a day (BID) | INTRAVENOUS | Status: DC

## 2016-12-07 MED ORDER — HEPARIN SODIUM (PORCINE) 1000 UNIT/ML IJ SOLN
INTRAMUSCULAR | Status: DC | PRN
  Administered 2016-12-07: 5000 [IU] via INTRAVENOUS

## 2016-12-07 MED ORDER — HEPARIN SODIUM (PORCINE) 1000 UNIT/ML IJ SOLN
INTRAMUSCULAR | Status: AC
  Filled 2016-12-07: qty 1

## 2016-12-07 MED ORDER — SODIUM CHLORIDE 0.9 % IV SOLN: 250.0000 mL | INTRAVENOUS | Status: DC | PRN

## 2016-12-07 MED ORDER — SODIUM CHLORIDE 0.9 % WEIGHT BASED INFUSION: 1.0000 mL/kg/h | INTRAVENOUS | Status: DC

## 2016-12-07 MED ORDER — HEPARIN (PORCINE) IN NACL 2-0.9 UNIT/ML-% IJ SOLN
INTRAMUSCULAR | Status: AC
  Filled 2016-12-07: qty 1000

## 2016-12-07 MED ORDER — LIDOCAINE HCL (PF) 1 % IJ SOLN
INTRAMUSCULAR | Status: DC | PRN
  Administered 2016-12-07: 2 mL

## 2016-12-07 MED ORDER — VERAPAMIL HCL 2.5 MG/ML IV SOLN
INTRAVENOUS | Status: AC
  Filled 2016-12-07: qty 2

## 2016-12-07 MED ORDER — FENTANYL CITRATE (PF) 100 MCG/2ML IJ SOLN
INTRAMUSCULAR | Status: AC
  Filled 2016-12-07: qty 2

## 2016-12-07 MED ORDER — HEPARIN (PORCINE) IN NACL 2-0.9 UNIT/ML-% IJ SOLN
INTRAMUSCULAR | Status: DC | PRN
  Administered 2016-12-07: 09:00:00

## 2016-12-07 MED ORDER — FENTANYL CITRATE (PF) 100 MCG/2ML IJ SOLN
INTRAMUSCULAR | Status: DC | PRN
  Administered 2016-12-07: 25 ug via INTRAVENOUS

## 2016-12-07 MED ORDER — MIDAZOLAM HCL 2 MG/2ML IJ SOLN
INTRAMUSCULAR | Status: AC
  Filled 2016-12-07: qty 2

## 2016-12-07 MED ORDER — SODIUM CHLORIDE 0.9 % WEIGHT BASED INFUSION
3.0000 mL/kg/h | INTRAVENOUS | Status: AC
Start: 2016-12-07 — End: 2016-12-07
  Administered 2016-12-07: 3 mL/kg/h via INTRAVENOUS

## 2016-12-07 MED ORDER — LIDOCAINE HCL 2 % IJ SOLN
INTRAMUSCULAR | Status: AC
  Filled 2016-12-07: qty 10

## 2016-12-07 MED ORDER — ASPIRIN 81 MG PO CHEW: 81.0000 mg | CHEWABLE_TABLET | ORAL | Status: DC

## 2016-12-07 MED ORDER — IOPAMIDOL (ISOVUE-370) INJECTION 76%
INTRAVENOUS | Status: AC
  Filled 2016-12-07: qty 100

## 2016-12-07 MED ORDER — HEPARIN (PORCINE) IN NACL 2-0.9 UNIT/ML-% IJ SOLN
INTRAMUSCULAR | Status: DC | PRN
  Administered 2016-12-07: 10 mL via INTRA_ARTERIAL

## 2016-12-07 MED ORDER — MIDAZOLAM HCL 2 MG/2ML IJ SOLN
INTRAMUSCULAR | Status: DC | PRN
  Administered 2016-12-07: 2 mg via INTRAVENOUS

## 2016-12-07 MED ORDER — IOPAMIDOL (ISOVUE-370) INJECTION 76%
INTRAVENOUS | Status: DC | PRN
  Administered 2016-12-07: 65 mL via INTRAVENOUS

## 2016-12-07 SURGICAL SUPPLY — 9 items
CATH 5FR JL3.5 JR4 ANG PIG MP (CATHETERS) ×2 IMPLANT
DEVICE RAD COMP TR BAND LRG (VASCULAR PRODUCTS) ×2 IMPLANT
GLIDESHEATH SLEND SS 6F .021 (SHEATH) ×2 IMPLANT
GUIDEWIRE INQWIRE 1.5J.035X260 (WIRE) ×1 IMPLANT
INQWIRE 1.5J .035X260CM (WIRE) ×2
KIT HEART LEFT (KITS) ×2 IMPLANT
PACK CARDIAC CATHETERIZATION (CUSTOM PROCEDURE TRAY) ×2 IMPLANT
TRANSDUCER W/STOPCOCK (MISCELLANEOUS) ×2 IMPLANT
TUBING CIL FLEX 10 FLL-RA (TUBING) ×2 IMPLANT

## 2016-12-07 NOTE — Discharge Instructions (Signed)

## 2016-12-07 NOTE — Interval H&P Note (Signed)
Cath Lab Visit (complete for each Cath Lab visit)  Clinical Evaluation Leading to the Procedure:   ACS: No.  Non-ACS:    Anginal Classification: CCS III  Anti-ischemic medical therapy: Minimal Therapy (1 class of medications)  Non-Invasive Test Results: Intermediate-risk stress test findings: cardiac mortality 1-3%/year  Prior CABG: No previous CABG      History and Physical Interval Note:  12/07/2016 8:30 AM  Travis Page  has presented today for surgery, with the diagnosis of cp - abnormal stress test  The various methods of treatment have been discussed with the patient and family. After consideration of risks, benefits and other options for treatment, the patient has consented to  Procedure(s): LEFT HEART CATH AND CORONARY ANGIOGRAPHY (N/A) as a surgical intervention .  The patient's history has been reviewed, patient examined, no change in status, stable for surgery.  I have reviewed the patient's chart and labs.  Questions were answered to the patient's satisfaction.     Larae Grooms

## 2016-12-07 NOTE — H&P (View-Only) (Signed)
Clinical Summary Travis Page is a 76 y.o.male seen as a new consult for chest pain, referred by Dr Gae Dry.   1. Chest pain - left sided chest pain sharp pain, lasted about 30 seconds. 10/10 in severity, can occur at rest or with activity. No other associated symptoms. EPisodes occurs about every 2 weeks - can have some SOB/DOE with his yardworks which is new, gets severely diaphoretic - 10/2016 nuclear stress: moderate inferior ischemia, LVEF 54%, intermediate risk   CAD risk factors: HTN, HL, father MI, mother MI, sister with stents and CABG.   Past Medical History:  Diagnosis Date  . Fatigue   . High cholesterol   . Hypertension   . Shortness of breath      Allergies  Allergen Reactions  . Prednisone     Chest pain. Can take Decadron per records  . Statins     Muscle aches.     Current Outpatient Prescriptions  Medication Sig Dispense Refill  . aspirin 81 MG tablet Take 1 tablet (81 mg total) by mouth daily. 30 tablet   . cyclobenzaprine (FLEXERIL) 10 MG tablet Take 10 mg by mouth 3 (three) times daily as needed for muscle spasms.    Marland Kitchen dutasteride (AVODART) 0.5 MG capsule Take 0.5 mg by mouth daily.    . fenofibrate 160 MG tablet Take 160 mg by mouth daily.    Marland Kitchen HYDROcodone-acetaminophen (NORCO) 10-325 MG tablet Take by mouth.    Marland Kitchen HYDROcodone-homatropine (HYCODAN) 5-1.5 MG/5ML syrup Take 5 mLs by mouth every 6 (six) hours as needed for cough.    . losartan (COZAAR) 50 MG tablet Take 50 mg by mouth daily.    . meclizine (ANTIVERT) 25 MG tablet Take 25 mg by mouth 3 (three) times daily as needed for dizziness.    . pantoprazole (PROTONIX) 40 MG tablet Take 40 mg by mouth daily.    . promethazine (PHENERGAN) 25 MG tablet Take 25 mg by mouth every 6 (six) hours as needed for nausea or vomiting.    Marland Kitchen zolpidem (AMBIEN) 10 MG tablet Take 10 mg by mouth at bedtime.     No current facility-administered medications for this visit.      Past Surgical History:    Procedure Laterality Date  . CHOLECYSTECTOMY    . ESOPHAGEAL DILATION N/A 12/10/2015   Procedure: ESOPHAGEAL DILATION;  Surgeon: Rogene Houston, MD;  [facility]: [facility];  Service: Endoscopy;  Laterality: N/A;  . ESOPHAGOGASTRODUODENOSCOPY N/A 12/10/2015   Procedure: ESOPHAGOGASTRODUODENOSCOPY (EGD);  Surgeon: Rogene Houston, MD;  [facility]: [facility];  Service: Endoscopy;  Laterality: N/A;  . REPLACEMENT TOTAL KNEE     rt knee      Allergies  Allergen Reactions  . Prednisone     Chest pain. Can take Decadron per records  . Statins     Muscle aches.      Family History  Problem Relation Age of Onset  . Heart attack Mother   . Hypercholesterolemia Mother   . Heart attack Father   . Hypercholesterolemia Sister   . CAD Sister      Social History Travis Page reports that he has never smoked. He uses smokeless tobacco. Travis Page reports that he does not drink alcohol.   Review of Systems CONSTITUTIONAL: No weight loss, fever, chills, weakness or fatigue.  HEENT: Eyes: No visual loss, blurred vision, double vision or yellow sclerae.No hearing loss, sneezing, congestion, runny nose or sore throat.  SKIN: No rash or  itching.  CARDIOVASCULAR: per hpi RESPIRATORY: per hpi GASTROINTESTINAL: No anorexia, nausea, vomiting or diarrhea. No abdominal pain or blood.  GENITOURINARY: No burning on urination, no polyuria NEUROLOGICAL: No headache, dizziness, syncope, paralysis, ataxia, numbness or tingling in the extremities. No change in bowel or bladder control.  MUSCULOSKELETAL: No muscle, back pain, joint pain or stiffness.  LYMPHATICS: No enlarged nodes. No history of splenectomy.  PSYCHIATRIC: No history of depression or anxiety.  ENDOCRINOLOGIC: No reports of sweating, cold or heat intolerance. No polyuria or polydipsia.  Marland Kitchen   Physical Examination Vitals:   11/30/16 1339  BP: 122/70  Pulse: 75  SpO2: 96%   Vitals:   11/30/16 1339  Weight: 201 lb (91.2 kg)   Height: 6\' 1"  (1.854 m)    Gen: resting comfortably, no acute distress HEENT: no scleral icterus, pupils equal round and reactive, no palptable cervical adenopathy,  CV: RRR, no m/r/g, no jvd Resp: Clear to auscultation bilaterally GI: abdomen is soft, non-tender, non-distended, normal bowel sounds, no hepatosplenomegaly MSK: extremities are warm, no edema.  Skin: warm, no rash Neuro:  no focal deficits Psych: appropriate affect     Assessment and Plan   1. Chest pain - recent chest pain symptoms in patient with multiple CAD risk factors and recent abnormal nuclear stress test showing moderate inferior ischemia - we will plan for cardiac cath.  - Given Rx for SL NG   I have reviewed the risks, indications, and alternatives to cardiac catheterization, possible angioplasty, and stenting with the patient  today. Risks include but are not limited to bleeding, infection, vascular injury, stroke, myocardial infection, arrhythmia, kidney injury, radiation-related injury in the case of prolonged fluoroscopy use, emergency cardiac surgery, and death. The patient understands the risks of serious complication is 1-2 in 5329 with diagnostic cardiac cath and 1-2% or less with angioplasty/stenting.   F/u 1 month   Arnoldo Lenis, M.D., F.A.C.C.

## 2016-12-08 ENCOUNTER — Encounter (HOSPITAL_COMMUNITY): Payer: Self-pay | Admitting: Interventional Cardiology

## 2016-12-13 DIAGNOSIS — D519 Vitamin B12 deficiency anemia, unspecified: Secondary | ICD-10-CM | POA: Diagnosis not present

## 2016-12-23 DIAGNOSIS — E539 Vitamin B deficiency, unspecified: Secondary | ICD-10-CM | POA: Diagnosis not present

## 2016-12-23 DIAGNOSIS — E559 Vitamin D deficiency, unspecified: Secondary | ICD-10-CM | POA: Diagnosis not present

## 2016-12-23 DIAGNOSIS — D126 Benign neoplasm of colon, unspecified: Secondary | ICD-10-CM | POA: Diagnosis not present

## 2016-12-23 DIAGNOSIS — E782 Mixed hyperlipidemia: Secondary | ICD-10-CM | POA: Diagnosis not present

## 2016-12-28 DIAGNOSIS — R7301 Impaired fasting glucose: Secondary | ICD-10-CM | POA: Diagnosis not present

## 2016-12-28 DIAGNOSIS — Z0001 Encounter for general adult medical examination with abnormal findings: Secondary | ICD-10-CM | POA: Diagnosis not present

## 2016-12-28 DIAGNOSIS — D126 Benign neoplasm of colon, unspecified: Secondary | ICD-10-CM | POA: Diagnosis not present

## 2016-12-28 DIAGNOSIS — I10 Essential (primary) hypertension: Secondary | ICD-10-CM | POA: Diagnosis not present

## 2017-01-06 ENCOUNTER — Ambulatory Visit (INDEPENDENT_AMBULATORY_CARE_PROVIDER_SITE_OTHER): Payer: Medicare Other | Admitting: Cardiology

## 2017-01-06 ENCOUNTER — Encounter: Payer: Self-pay | Admitting: Cardiology

## 2017-01-06 ENCOUNTER — Encounter (INDEPENDENT_AMBULATORY_CARE_PROVIDER_SITE_OTHER): Payer: Self-pay

## 2017-01-06 VITALS — BP 138/71 | HR 65 | Ht 73.0 in | Wt 201.6 lb

## 2017-01-06 DIAGNOSIS — I10 Essential (primary) hypertension: Secondary | ICD-10-CM | POA: Diagnosis not present

## 2017-01-06 DIAGNOSIS — E782 Mixed hyperlipidemia: Secondary | ICD-10-CM | POA: Diagnosis not present

## 2017-01-06 DIAGNOSIS — R0789 Other chest pain: Secondary | ICD-10-CM

## 2017-01-06 NOTE — Progress Notes (Signed)
Clinical Summary Travis Page is a 76 y.o.male seen today for follow up of the following medical problems.   1. Chest pain - left sided chest pain sharp pain, lasted about 30 seconds. 10/10 in severity, can occur at rest or with activity. No other associated symptoms. EPisodes occurs about every 2 weeks - can have some SOB/DOE with his yardworks which is new, gets severely diaphoretic - 10/2016 nuclear stress: moderate inferior ischemia, LVEF 54%, intermediate risk  11/2016 cath as reported below, essentially mild main vessel disease and more significant small vessel disease not amenable to revascularization.  - no recent chest pain. Recent SOB/DOE.  - compliant with meds    2. HTN - he is compliant with meds  3. Hyperlipidemia - he does not recall history of statin intolerance though listed in his chart, appears this was initially managed by his pcp - management has been deferred to his pcp - 06/2016 TC 173 TG 171 HDL 39 LDL 100   Past Medical History:  Diagnosis Date  . Fatigue   . High cholesterol   . Hypertension   . Shortness of breath      Allergies  Allergen Reactions  . Prednisone     Chest pain. Can take Decadron per records  . Statins     Muscle aches.     Current Outpatient Prescriptions  Medication Sig Dispense Refill  . Ascorbic Acid (VITAMIN C) 1000 MG tablet Take 1,000 mg by mouth daily.    Marland Kitchen aspirin EC 81 MG tablet Take 81 mg by mouth at bedtime.    . Cholecalciferol (VITAMIN D) 2000 units CAPS Take 2,000 Units by mouth daily.    Marland Kitchen dutasteride (AVODART) 0.5 MG capsule Take 0.5 mg by mouth daily.    . fenofibrate 160 MG tablet Take 160 mg by mouth daily.    Marland Kitchen HYDROcodone-acetaminophen (NORCO) 10-325 MG tablet Take 1 tablet by mouth every 6 (six) hours as needed (for pain).     Marland Kitchen losartan-hydrochlorothiazide (HYZAAR) 50-12.5 MG tablet Take 1 tablet by mouth at bedtime.     . Multiple Vitamin (MULTIVITAMIN WITH MINERALS) TABS tablet Take 1 tablet by  mouth daily.    . nitroGLYCERIN (NITROSTAT) 0.4 MG SL tablet Place 1 tablet (0.4 mg total) under the tongue every 5 (five) minutes as needed for chest pain. 25 tablet 3  . pantoprazole (PROTONIX) 40 MG tablet Take 40 mg by mouth daily as needed (for acid reflux/indigestion.).     Marland Kitchen zolpidem (AMBIEN) 10 MG tablet Take 10 mg by mouth at bedtime.     No current facility-administered medications for this visit.      Past Surgical History:  Procedure Laterality Date  . CHOLECYSTECTOMY    . ESOPHAGEAL DILATION N/A 12/10/2015   Procedure: ESOPHAGEAL DILATION;  Surgeon: Rogene Houston, MD;  Location: [facility];  Service: Endoscopy;  Laterality: N/A;  . ESOPHAGOGASTRODUODENOSCOPY N/A 12/10/2015   Procedure: ESOPHAGOGASTRODUODENOSCOPY (EGD);  Surgeon: Rogene Houston, MD;  [facility]: [facility];  Service: Endoscopy;  Laterality: N/A;  . LEFT HEART CATH AND CORONARY ANGIOGRAPHY N/A 12/07/2016   Procedure: LEFT HEART CATH AND CORONARY ANGIOGRAPHY;  Surgeon: Jettie Booze, MD;  Location: [place] [facility];  Service: Cardiovascular;  Laterality: N/A;  . REPLACEMENT TOTAL KNEE     rt knee      Allergies  Allergen Reactions  . Prednisone     Chest pain. Can take Decadron per records  . Statins  Muscle aches.      Family History  Problem Relation Age of Onset  . Heart attack Mother   . Hypercholesterolemia Mother   . Heart attack Father   . Hypercholesterolemia Sister   . CAD Sister      Social History Travis Page reports that he has never smoked. His smokeless tobacco use includes Chew. Travis Page reports that he does not drink alcohol.   Review of Systems CONSTITUTIONAL: No weight loss, fever, chills, weakness or fatigue.  HEENT: Eyes: No visual loss, blurred vision, double vision or yellow sclerae.No hearing loss, sneezing, congestion, runny nose or sore throat.  SKIN: No rash or itching.  CARDIOVASCULAR: per hpi RESPIRATORY: No shortness of breath, cough or  sputum.  GASTROINTESTINAL: No anorexia, nausea, vomiting or diarrhea. No abdominal pain or blood.  GENITOURINARY: No burning on urination, no polyuria NEUROLOGICAL: No headache, dizziness, syncope, paralysis, ataxia, numbness or tingling in the extremities. No change in bowel or bladder control.  MUSCULOSKELETAL: No muscle, back pain, joint pain or stiffness.  LYMPHATICS: No enlarged nodes. No history of splenectomy.  PSYCHIATRIC: No history of depression or anxiety.  ENDOCRINOLOGIC: No reports of sweating, cold or heat intolerance. No polyuria or polydipsia.  Marland Kitchen   Physical Examination Vitals:   01/06/17 0916  BP: 138/71  Pulse: 65   Vitals:   01/06/17 0916  Weight: 201 lb 9.6 oz (91.4 kg)  Height: 6\' 1"  (1.854 m)    Gen: resting comfortably, no acute distress HEENT: no scleral icterus, pupils equal round and reactive, no palptable cervical adenopathy,  CV: RRR, no mrg, no jvd Resp: Clear to auscultation bilaterally GI: abdomen is soft, non-tender, non-distended, normal bowel sounds, no hepatosplenomegaly MSK: extremities are warm, no edema.  Skin: warm, no rash Neuro:  no focal deficits Psych: appropriate affect   Diagnostic Studies 11/2016 cath  Mid LAD-2 lesion, 25 %stenosed.  Mid LAD-1 lesion, 25 %stenosed.  Ost 2nd Diag to 2nd Diag lesion, 25 %stenosed.  Ramus lesion, 25 %stenosed.  Dist Cx lesion, 70 %stenosed.  Ost LM to LM lesion, 20 %stenosed.  Ost RCA lesion, 25 %stenosed.  Prox RCA lesion, 30 %stenosed.  Dist RCA lesion, 30 %stenosed.  RPDA lesion, 80 %stenosed.  The left ventricular systolic function is normal.  LV end diastolic pressure is normal.  The left ventricular ejection fraction is 50-55% by visual estimate.  There is no aortic valve stenosis.   Mild disease in the main vessels. He does have significant small vessel disease including distal circumflex. This vessel would be too small to intervene upon. There is also a significant  lesion of the ostial PDA. The posterior lateral artery is a larger Prescious Hurless and larger territory supplied, and intervention of the PDA with compromise posterior lateral artery.  Would recommend medical therapy for potential anginal symptoms.  Continue aggressive risk factor modification including lipid-lowering therapy.   10/2016 nuclear stress: moderate inferior ischemia, LVEF 54%, intermediate risk      Assessment and Plan   1. Chest pain - cath with small vessel disease not amenable to revasc - continue medical therapy  2. Hyperlipidemia - defer management to pcp, patient with reported history of side effects to statins  3. HTN - mildly above goal, continue to follow trends at this time. May need further titration of meds at f/u      Arnoldo Lenis, M.D.

## 2017-01-06 NOTE — Patient Instructions (Signed)

## 2017-01-13 DIAGNOSIS — E539 Vitamin B deficiency, unspecified: Secondary | ICD-10-CM | POA: Diagnosis not present

## 2017-02-13 DIAGNOSIS — E539 Vitamin B deficiency, unspecified: Secondary | ICD-10-CM | POA: Diagnosis not present

## 2017-03-15 DIAGNOSIS — R05 Cough: Secondary | ICD-10-CM | POA: Diagnosis not present

## 2017-03-15 DIAGNOSIS — J209 Acute bronchitis, unspecified: Secondary | ICD-10-CM | POA: Diagnosis not present

## 2017-03-15 DIAGNOSIS — J320 Chronic maxillary sinusitis: Secondary | ICD-10-CM | POA: Diagnosis not present

## 2017-03-15 DIAGNOSIS — I10 Essential (primary) hypertension: Secondary | ICD-10-CM | POA: Diagnosis not present

## 2017-03-29 DIAGNOSIS — Z961 Presence of intraocular lens: Secondary | ICD-10-CM | POA: Diagnosis not present

## 2017-04-17 DIAGNOSIS — D519 Vitamin B12 deficiency anemia, unspecified: Secondary | ICD-10-CM | POA: Diagnosis not present

## 2017-04-17 DIAGNOSIS — E782 Mixed hyperlipidemia: Secondary | ICD-10-CM | POA: Diagnosis not present

## 2017-04-17 DIAGNOSIS — J320 Chronic maxillary sinusitis: Secondary | ICD-10-CM | POA: Diagnosis not present

## 2017-04-17 DIAGNOSIS — I10 Essential (primary) hypertension: Secondary | ICD-10-CM | POA: Diagnosis not present

## 2017-04-27 DIAGNOSIS — J320 Chronic maxillary sinusitis: Secondary | ICD-10-CM | POA: Diagnosis not present

## 2017-05-16 DIAGNOSIS — J342 Deviated nasal septum: Secondary | ICD-10-CM | POA: Diagnosis not present

## 2017-05-16 DIAGNOSIS — J310 Chronic rhinitis: Secondary | ICD-10-CM | POA: Diagnosis not present

## 2017-05-16 DIAGNOSIS — J343 Hypertrophy of nasal turbinates: Secondary | ICD-10-CM | POA: Diagnosis not present

## 2017-05-16 DIAGNOSIS — J0141 Acute recurrent pansinusitis: Secondary | ICD-10-CM | POA: Diagnosis not present

## 2017-06-26 DIAGNOSIS — I10 Essential (primary) hypertension: Secondary | ICD-10-CM | POA: Diagnosis not present

## 2017-06-26 DIAGNOSIS — R7301 Impaired fasting glucose: Secondary | ICD-10-CM | POA: Diagnosis not present

## 2017-06-26 DIAGNOSIS — R9439 Abnormal result of other cardiovascular function study: Secondary | ICD-10-CM | POA: Diagnosis not present

## 2017-06-26 DIAGNOSIS — K219 Gastro-esophageal reflux disease without esophagitis: Secondary | ICD-10-CM | POA: Diagnosis not present

## 2017-06-28 DIAGNOSIS — Z0001 Encounter for general adult medical examination with abnormal findings: Secondary | ICD-10-CM | POA: Diagnosis not present

## 2017-06-28 DIAGNOSIS — I10 Essential (primary) hypertension: Secondary | ICD-10-CM | POA: Diagnosis not present

## 2017-06-28 DIAGNOSIS — E782 Mixed hyperlipidemia: Secondary | ICD-10-CM | POA: Diagnosis not present

## 2017-06-28 DIAGNOSIS — R7301 Impaired fasting glucose: Secondary | ICD-10-CM | POA: Diagnosis not present

## 2017-07-10 ENCOUNTER — Ambulatory Visit: Payer: Medicare Other | Admitting: Cardiology

## 2017-07-10 ENCOUNTER — Encounter: Payer: Self-pay | Admitting: Cardiology

## 2017-07-10 ENCOUNTER — Encounter: Payer: Self-pay | Admitting: *Deleted

## 2017-07-10 VITALS — BP 144/88 | HR 62 | Ht 73.0 in | Wt 200.0 lb

## 2017-07-10 DIAGNOSIS — I10 Essential (primary) hypertension: Secondary | ICD-10-CM | POA: Diagnosis not present

## 2017-07-10 DIAGNOSIS — R0789 Other chest pain: Secondary | ICD-10-CM | POA: Diagnosis not present

## 2017-07-10 NOTE — Progress Notes (Signed)
Clinical Summary [name] is a 77 y.o.male seen today for follow up of the following medical problems.   1. Chest pain - 10/2016 nuclear stress: moderate inferior ischemia, LVEF 54%, intermediate risk - 11/2016 cath as reported below, essentially mild main vessel disease and more significant small vessel disease not amenable to revascularization.  - no chest pain. No SOB/DOE - compliant with meds  2. HTN - he is compliant with meds - reports bp at pcp 130/70s just a few weeks ago   3. Hyperlipidemia - he does not recall history of statin intolerance though listed in his chart, appears this was initially managed by his pcp - management has been deferred to his pcp     Past Medical History:  Diagnosis Date  . Fatigue   . High cholesterol   . Hypertension   . Shortness of breath      Allergies  Allergen Reactions  . Prednisone     Chest pain. Can take Decadron per records  . Statins     Muscle aches.     Current Outpatient Medications  Medication Sig Dispense Refill  . Ascorbic Acid (VITAMIN C) 1000 MG tablet Take 1,000 mg by mouth daily.    Marland Kitchen aspirin EC 81 MG tablet Take 81 mg by mouth at bedtime.    . Cholecalciferol (VITAMIN D) 2000 units CAPS Take 2,000 Units by mouth daily.    Marland Kitchen dutasteride (AVODART) 0.5 MG capsule Take 0.5 mg by mouth daily.    . fenofibrate 160 MG tablet Take 160 mg by mouth daily.    Marland Kitchen HYDROcodone-acetaminophen (NORCO) 10-325 MG tablet Take 1 tablet by mouth every 6 (six) hours as needed (for pain).     Marland Kitchen losartan-hydrochlorothiazide (HYZAAR) 50-12.5 MG tablet Take 1 tablet by mouth at bedtime.     . Multiple Vitamin (MULTIVITAMIN WITH MINERALS) TABS tablet Take 1 tablet by mouth daily.    . nitroGLYCERIN (NITROSTAT) 0.4 MG SL tablet Place 1 tablet (0.4 mg total) under the tongue every 5 (five) minutes as needed for chest pain. 25 tablet 3  . pantoprazole (PROTONIX) 40 MG tablet Take 40 mg by mouth daily as needed (for acid  reflux/indigestion.).     Marland Kitchen zolpidem (AMBIEN) 10 MG tablet Take 10 mg by mouth at bedtime.     No current facility-administered medications for this visit.      Past Surgical History:  Procedure Laterality Date  . CHOLECYSTECTOMY    . ESOPHAGEAL DILATION N/A 12/10/2015   Procedure: ESOPHAGEAL DILATION;  Surgeon: Travis Houston, MD;  Location: [facility];  Service: Endoscopy;  Laterality: N/A;  . ESOPHAGOGASTRODUODENOSCOPY N/A 12/10/2015   Procedure: ESOPHAGOGASTRODUODENOSCOPY (EGD);  Surgeon: Travis Houston, MD;  Location: [facility];  Service: Endoscopy;  Laterality: N/A;  . LEFT HEART CATH AND CORONARY ANGIOGRAPHY N/A 12/07/2016   Procedure: LEFT HEART CATH AND CORONARY ANGIOGRAPHY;  Surgeon: Travis Booze, MD;  Location: [facility];  Service: Cardiovascular;  Laterality: N/A;  . REPLACEMENT TOTAL KNEE     rt knee      Allergies  Allergen Reactions  . Prednisone     Chest pain. Can take Decadron per records  . Statins     Muscle aches.      Family History  Problem Relation Age of Onset  . Heart attack Mother   . Hypercholesterolemia Mother   . Heart attack Father   . Hypercholesterolemia Sister   . CAD Sister  Social History Travis Page reports that he has never smoked. His smokeless tobacco use includes chew. Travis Page reports that he does not drink alcohol.   Review of Systems CONSTITUTIONAL: No weight loss, fever, chills, weakness or fatigue.  HEENT: Eyes: No visual loss, blurred vision, double vision or yellow sclerae.No hearing loss, sneezing, congestion, runny nose or sore throat.  SKIN: No rash or itching.  CARDIOVASCULAR: per hpi RESPIRATORY: No shortness of breath, cough or sputum.  GASTROINTESTINAL: No anorexia, nausea, vomiting or diarrhea. No abdominal pain or blood.  GENITOURINARY: No burning on urination, no polyuria NEUROLOGICAL: No headache, dizziness, syncope, paralysis, ataxia, numbness or tingling in the extremities.  No change in bowel or bladder control.  MUSCULOSKELETAL: No muscle, back pain, joint pain or stiffness.  LYMPHATICS: No enlarged nodes. No history of splenectomy.  PSYCHIATRIC: No history of depression or anxiety.  ENDOCRINOLOGIC: No reports of sweating, cold or heat intolerance. No polyuria or polydipsia.  Marland Kitchen   Physical Examination Vitals:   07/10/17 0819  BP: (!) 144/88  Pulse: 62  SpO2: 98%   Vitals:   07/10/17 0819  Weight: 200 lb (90.7 kg)  Height: 6\' 1"  (1.854 m)    Gen: resting comfortably, no acute distress HEENT: no scleral icterus, pupils equal round and reactive, no palptable cervical adenopathy,  CV: RRR, no m/r/g, no jvd Resp: Clear to auscultation bilaterally GI: abdomen is soft, non-tender, non-distended, normal bowel sounds, no hepatosplenomegaly MSK: extremities are warm, no edema.  Skin: warm, no rash Neuro:  no focal deficits Psych: appropriate affect   Diagnostic Studies  11/2016 cath  Mid LAD-2 lesion, 25 %stenosed.  Mid LAD-1 lesion, 25 %stenosed.  Ost 2nd Diag to 2nd Diag lesion, 25 %stenosed.  Ramus lesion, 25 %stenosed.  Dist Cx lesion, 70 %stenosed.  Ost LM to LM lesion, 20 %stenosed.  Ost RCA lesion, 25 %stenosed.  Prox RCA lesion, 30 %stenosed.  Dist RCA lesion, 30 %stenosed.  RPDA lesion, 80 %stenosed.  The left ventricular systolic function is normal.  LV end diastolic pressure is normal.  The left ventricular ejection fraction is 50-55% by visual estimate.  There is no aortic valve stenosis.  Mild disease in the main vessels. He does have significant small vessel disease including distal circumflex. This vessel would be too small to intervene upon. 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 The posterior lateral artery is a larger branch and larger territory supplied, and intervention of the PDA with compromise posterior lateral artery. Would recommend medical therapy for potential anginal symptoms.  Continue  aggressive risk factor modification including lipid-lowering therapy.   10/2016 nuclear stress: moderate inferior ischemia, LVEF 54%, intermediate risk      Assessment and Plan  1. Chest pain - cath with small vessel disease not amenable to revasc - no recent symptoms, continue current meds   2.HTN - elevated in clinic. He reports bp at target at recent pcp visit - request pcp records. Given info on DASH diet, lifestyle modification - if persistently elevated will need to incrase his ARB   3. Hyperlipidemia - deferred to pcp who has the history regarding his statin intolerance.        Arnoldo Lenis, M.D.

## 2017-07-10 NOTE — Patient Instructions (Signed)
Your physician wants you to follow-up in: [place] will receive a reminder letter in the mail two months in advance. If you don't receive a letter, please call our office to schedule the follow-up appointment.  Your physician recommends that you continue on your current medications as directed. Please refer to the Current Medication list given to you today.   DASH Eating Plan DASH stands for "Dietary Approaches to Stop Hypertension." The DASH eating plan is a healthy eating plan that has been shown to reduce high blood pressure (hypertension). It may also reduce your risk for type 2 diabetes, heart disease, and stroke. The DASH eating plan may also help with weight loss. What are tips for following this plan? General guidelines  Avoid eating more than 2,300 mg (milligrams) of salt (sodium) a day. If you have hypertension, you may need to reduce your sodium intake to 1,500 mg a day.  Limit alcohol intake to no more than 1 drink a day for nonpregnant women and 2 drinks a day for men. One drink equals 12 oz of beer, 5 oz of wine, or 1 oz of hard liquor.  Work with your health care provider to maintain a healthy body weight or to lose weight. Ask what an ideal weight is for you.  Get at least 30 minutes of exercise that causes your heart to beat faster (aerobic exercise) most days of the week. Activities may include walking, swimming, or biking.  Work with your health care provider or diet and nutrition specialist (dietitian) to adjust your eating plan to your individual calorie needs. Reading food labels  Check food labels for the amount of sodium per serving. Choose foods with less than 5 percent of the Daily Value of sodium. Generally, foods with less than 300 mg of sodium per serving fit into this eating plan.  To find whole grains, look for the word "whole" as the first word in the ingredient list. Shopping  Buy products labeled as "low-sodium" or "no salt added."  Buy  fresh foods. Avoid canned foods and premade or frozen meals. Cooking  Avoid adding salt when cooking. Use salt-free seasonings or herbs instead of table salt or sea salt. Check with your health care provider or pharmacist before using salt substitutes.  Do not fry foods. Cook foods using healthy methods such as baking, boiling, grilling, and broiling instead.  Cook with heart-healthy oils, such as olive, canola, soybean, or sunflower oil. Meal planning   Eat a balanced diet that includes: ? 5 or more servings of fruits and vegetables each day. At each meal, try to fill half of your plate with fruits and vegetables. ? Up to 6-8 servings of whole grains each day. ? Less than 6 oz of lean meat, poultry, or fish each day. A 3-oz serving of meat is about the same size as a deck of cards. One egg equals 1 oz. ? 2 servings of low-fat dairy each day. ? A serving of nuts, seeds, or beans 5 times each week. ? Heart-healthy fats. Healthy fats called Omega-3 fatty acids are found in foods such as flaxseeds and coldwater fish, like sardines, salmon, and mackerel.  Limit how much you eat of the following: ? Canned or prepackaged foods. ? Food that is high in trans fat, such as fried foods. ? Food that is high in saturated fat, such as fatty meat. ? Sweets, desserts, sugary drinks, and other foods with added sugar. ? Full-fat dairy products.  Do not  salt foods before eating.  Try to eat at least 2 vegetarian meals each week.  Eat more home-cooked food and less restaurant, buffet, and fast food.  When eating at a restaurant, ask that your food be prepared with less salt or no salt, if possible. What foods are recommended? The items listed may not be a complete list. Talk with your dietitian about what dietary choices are best for you. Grains Whole-grain or whole-wheat bread. Whole-grain or whole-wheat pasta. Brown rice. [place]row. Bulgur. Whole-grain and low-sodium cereals. Pita bread.  Low-fat, low-sodium crackers. Whole-wheat flour tortillas. Vegetables Fresh or frozen vegetables (raw, steamed, roasted, or grilled). Low-sodium or reduced-sodium tomato and vegetable juice. Low-sodium or reduced-sodium tomato sauce and tomato paste. Low-sodium or reduced-sodium canned vegetables. Fruits All fresh, dried, or frozen fruit. Canned fruit in natural juice (without added sugar). Meat and other protein foods Skinless chicken or [place]. Ground chicken or [place]. Pork with fat trimmed off. Fish and seafood. Egg whites. Dried beans, peas, or lentils. Unsalted nuts, nut butters, and seeds. Unsalted canned beans. Lean cuts of beef with fat trimmed off. Low-sodium, lean deli meat. Dairy Low-fat (1%) or fat-free (skim) milk. Fat-free, low-fat, or reduced-fat cheeses. Nonfat, low-sodium ricotta or cottage cheese. Low-fat or nonfat yogurt. Low-fat, low-sodium cheese. Fats and oils Soft margarine without trans fats. Vegetable oil. Low-fat, reduced-fat, or light mayonnaise and salad dressings (reduced-sodium). Canola, safflower, olive, soybean, and sunflower oils. Avocado. Seasoning and other foods Herbs. Spices. Seasoning mixes without salt. Unsalted popcorn and pretzels. Fat-free sweets. What foods are not recommended? The items listed may not be a complete list. Talk with your dietitian about what dietary choices are best for you. Grains Baked goods made with fat, such as croissants, muffins, or some breads. Dry pasta or rice meal packs. Vegetables Creamed or fried vegetables. Vegetables in a cheese sauce. Regular canned vegetables (not low-sodium or reduced-sodium). Regular canned tomato sauce and paste (not low-sodium or reduced-sodium). Regular tomato and vegetable juice (not low-sodium or reduced-sodium). Angie Fava. Olives. Fruits Canned fruit in a light or heavy syrup. Fried fruit. Fruit in cream or butter sauce. Meat and other protein foods Fatty cuts of meat. Ribs. Fried meat. Berniece Salines.  Sausage. Bologna and other processed lunch meats. Salami. Fatback. Hotdogs. Bratwurst. Salted nuts and seeds. Canned beans with added salt. Canned or smoked fish. Whole eggs or egg yolks. Chicken or [place] with skin. Dairy Whole or 2% milk, cream, and half-and-half. Whole or full-fat cream cheese. Whole-fat or sweetened yogurt. Full-fat cheese. Nondairy creamers. Whipped toppings. Processed cheese and cheese spreads. Fats and oils Butter. Stick margarine. Lard. Shortening. Ghee. Bacon fat. Tropical oils, such as coconut, palm kernel, or palm oil. Seasoning and other foods Salted popcorn and pretzels. Onion salt, garlic salt, seasoned salt, table salt, and sea salt. Worcestershire sauce. Tartar sauce. Barbecue sauce. Teriyaki sauce. Soy sauce, including reduced-sodium. Steak sauce. Canned and packaged gravies. Fish sauce. Oyster sauce. Cocktail sauce. Horseradish that you find on the shelf. Ketchup. Mustard. Meat flavorings and tenderizers. Bouillon cubes. Hot sauce and Tabasco sauce. Premade or packaged marinades. Premade or packaged taco seasonings. Relishes. Regular salad dressings. Where to find more information:  National Heart, Lung, and [place]: https://wilson-eaton.com/  American Heart Association: www.heart.org Summary  The DASH eating plan is a healthy eating plan that has been shown to reduce high blood pressure (hypertension). It may also reduce your risk for type 2 diabetes, heart disease, and stroke.  With the DASH eating plan, you should limit salt (sodium) intake to  2,300 mg a day. If you have hypertension, you may need to reduce your sodium intake to 1,500 mg a day.  When on the DASH eating plan, aim to eat more fresh fruits and vegetables, whole grains, lean proteins, low-fat dairy, and heart-healthy fats.  Work with your health care provider or diet and nutrition specialist (dietitian) to adjust your eating plan to your individual calorie needs. This information is not intended  to replace advice given to you by your health care provider. Make sure you discuss any questions you have with your health care provider. Document Released: 02/24/2011 Document Revised: 02/29/2016 Document Reviewed: 02/29/2016 Elsevier Interactive Patient Education  [employer].

## 2017-07-28 DIAGNOSIS — D519 Vitamin B12 deficiency anemia, unspecified: Secondary | ICD-10-CM | POA: Diagnosis not present

## 2017-08-02 DIAGNOSIS — I10 Essential (primary) hypertension: Secondary | ICD-10-CM | POA: Diagnosis not present

## 2017-08-02 DIAGNOSIS — S39012A Strain of muscle, fascia and tendon of lower back, initial encounter: Secondary | ICD-10-CM | POA: Diagnosis not present

## 2017-08-02 DIAGNOSIS — Z6826 Body mass index (BMI) 26.0-26.9, adult: Secondary | ICD-10-CM | POA: Diagnosis not present

## 2017-08-02 DIAGNOSIS — R7301 Impaired fasting glucose: Secondary | ICD-10-CM | POA: Diagnosis not present

## 2017-08-25 DIAGNOSIS — D519 Vitamin B12 deficiency anemia, unspecified: Secondary | ICD-10-CM | POA: Diagnosis not present

## 2017-09-25 DIAGNOSIS — D519 Vitamin B12 deficiency anemia, unspecified: Secondary | ICD-10-CM | POA: Diagnosis not present

## 2017-10-04 DIAGNOSIS — L570 Actinic keratosis: Secondary | ICD-10-CM | POA: Diagnosis not present

## 2017-10-04 DIAGNOSIS — L821 Other seborrheic keratosis: Secondary | ICD-10-CM | POA: Diagnosis not present

## 2017-10-04 DIAGNOSIS — Z85828 Personal history of other malignant neoplasm of skin: Secondary | ICD-10-CM | POA: Diagnosis not present

## 2017-10-26 DIAGNOSIS — R7301 Impaired fasting glucose: Secondary | ICD-10-CM | POA: Diagnosis not present

## 2017-10-26 DIAGNOSIS — I10 Essential (primary) hypertension: Secondary | ICD-10-CM | POA: Diagnosis not present

## 2017-10-26 DIAGNOSIS — M19012 Primary osteoarthritis, left shoulder: Secondary | ICD-10-CM | POA: Diagnosis not present

## 2017-10-26 DIAGNOSIS — D519 Vitamin B12 deficiency anemia, unspecified: Secondary | ICD-10-CM | POA: Diagnosis not present

## 2017-10-26 DIAGNOSIS — S46012A Strain of muscle(s) and tendon(s) of the rotator cuff of left shoulder, initial encounter: Secondary | ICD-10-CM | POA: Diagnosis not present

## 2017-10-31 DIAGNOSIS — M19012 Primary osteoarthritis, left shoulder: Secondary | ICD-10-CM | POA: Diagnosis not present

## 2017-11-14 DIAGNOSIS — J320 Chronic maxillary sinusitis: Secondary | ICD-10-CM | POA: Diagnosis not present

## 2017-11-14 DIAGNOSIS — J342 Deviated nasal septum: Secondary | ICD-10-CM | POA: Diagnosis not present

## 2017-11-14 DIAGNOSIS — J3089 Other allergic rhinitis: Secondary | ICD-10-CM | POA: Diagnosis not present

## 2017-11-14 DIAGNOSIS — J209 Acute bronchitis, unspecified: Secondary | ICD-10-CM | POA: Diagnosis not present

## 2017-11-23 DIAGNOSIS — D519 Vitamin B12 deficiency anemia, unspecified: Secondary | ICD-10-CM | POA: Diagnosis not present

## 2017-11-27 ENCOUNTER — Encounter (INDEPENDENT_AMBULATORY_CARE_PROVIDER_SITE_OTHER): Payer: Self-pay | Admitting: *Deleted

## 2017-11-27 ENCOUNTER — Encounter (INDEPENDENT_AMBULATORY_CARE_PROVIDER_SITE_OTHER): Payer: Self-pay | Admitting: Internal Medicine

## 2017-11-27 ENCOUNTER — Telehealth (INDEPENDENT_AMBULATORY_CARE_PROVIDER_SITE_OTHER): Payer: Self-pay | Admitting: *Deleted

## 2017-11-27 ENCOUNTER — Ambulatory Visit (INDEPENDENT_AMBULATORY_CARE_PROVIDER_SITE_OTHER): Payer: Medicare Other | Admitting: Internal Medicine

## 2017-11-27 VITALS — BP 142/86 | HR 64 | Temp 98.1°F | Ht 73.0 in | Wt 193.1 lb

## 2017-11-27 DIAGNOSIS — Z86010 Personal history of colonic polyps: Secondary | ICD-10-CM | POA: Diagnosis not present

## 2017-11-27 DIAGNOSIS — R1319 Other dysphagia: Secondary | ICD-10-CM

## 2017-11-27 DIAGNOSIS — R1310 Dysphagia, unspecified: Secondary | ICD-10-CM | POA: Insufficient documentation

## 2017-11-27 MED ORDER — SUPREP BOWEL PREP KIT 17.5-3.13-1.6 GM/177ML PO SOLN: 1.0000 | Freq: Once | ORAL | 0 refills | Status: AC

## 2017-11-27 NOTE — Patient Instructions (Signed)
The risks of bleeding, perforation and infection were reviewed with patient.  

## 2017-11-27 NOTE — Telephone Encounter (Signed)
Patient needs suprep 

## 2017-11-27 NOTE — Progress Notes (Signed)
Subjective:    Patient ID: Travis Page, male    DOB: January 12, 1941, 77 y.o.   MRN: [id number]  HPI Presents today with c/o dysphagia.  Last EGD/ED in September 2017.      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 The scope was withdrawn. Dilation was performed with a Maloney   dilator with no resistance at 6 Fr. The dilation site was examined mucosal disruption and showed moderate improvement in luminal narrowing. Having dysphagia for a couple of weeks. He says his pills hung in his esophagus.  He said they finally past. Also having trouble with meats.  Appetite is good. No weight loss. BMs are normal.  Last colonoscopy was in 2014. Hx of polyps.  Biopsy: Tubular adenoma.      Review of Systems Past Medical History:  Diagnosis Date  . Fatigue   . High cholesterol   . Hypertension   . Shortness of breath     Past Surgical History:  Procedure Laterality Date  . CHOLECYSTECTOMY    . ESOPHAGEAL DILATION N/A 12/10/2015   Procedure: ESOPHAGEAL DILATION;  Surgeon: Rogene Houston, MD;  [facility]: [facility];  Service: Endoscopy;  Laterality: N/A;  . ESOPHAGOGASTRODUODENOSCOPY N/A 12/10/2015   Procedure: ESOPHAGOGASTRODUODENOSCOPY (EGD);  Surgeon: Rogene Houston, MD;  [facility];  Service: Endoscopy;  Laterality: N/A;  . LEFT HEART CATH AND CORONARY ANGIOGRAPHY N/A 12/07/2016   Procedure: LEFT HEART CATH AND CORONARY ANGIOGRAPHY;  Surgeon: Jettie Booze, MD;  Location: [facility];  Service: Cardiovascular;  Laterality: N/A;  . REPLACEMENT TOTAL KNEE     rt knee     Allergies  Allergen Reactions  . Statins     Muscle aches.    Current Outpatient Medications on File Prior to Visit  Medication Sig Dispense Refill  . Ascorbic Acid (VITAMIN C) 1000 MG tablet Take 1,000 mg by mouth daily.    Marland Kitchen aspirin EC 81 MG tablet Take 81 mg by mouth at bedtime.    . Cholecalciferol (VITAMIN D) 2000 units CAPS Take 2,000 Units by  mouth daily.    Marland Kitchen dutasteride (AVODART) 0.5 MG capsule Take 0.5 mg by mouth daily.    . fenofibrate 160 MG tablet Take 160 mg by mouth daily.    Marland Kitchen HYDROcodone-acetaminophen (NORCO) 10-325 MG tablet Take 1 tablet by mouth every 6 (six) hours as needed (for pain).     Marland Kitchen losartan-hydrochlorothiazide (HYZAAR) 50-12.5 MG tablet Take 1 tablet by mouth daily.    . Multiple Vitamin (MULTIVITAMIN WITH MINERALS) TABS tablet Take 1 tablet by mouth daily.    . pantoprazole (PROTONIX) 40 MG tablet Take 40 mg by mouth daily as needed (for acid reflux/indigestion.).     Marland Kitchen vitamin B-12 (CYANOCOBALAMIN) 100 MCG tablet Take 100 mcg by mouth daily. Im monthly    . losartan (COZAAR) 50 MG tablet Take 50 mg by mouth daily.    . nitroGLYCERIN (NITROSTAT) 0.4 MG SL tablet Place 1 tablet (0.4 mg total) under the tongue every 5 (five) minutes as needed for chest pain. 25 tablet 3  . zolpidem (AMBIEN) 10 MG tablet Take 10 mg by mouth at bedtime.     No current facility-administered medications on file prior to visit.         Objective:   Physical Exam Blood pressure (!) 142/86, pulse 64, temperature 98.1 F (36.7 C), height 6\' 1"  (1.854 m), weight 193 lb 1.6 oz (87.6 kg). Alert and  oriented. Skin warm and dry. Oral mucosa is moist.   . Sclera anicteric, conjunctivae is pink. Thyroid not enlarged. No cervical lymphadenopathy. Lungs clear. Heart regular rate and rhythm.  Abdomen is soft. Bowel sounds are positive. No hepatomegaly. No abdominal masses felt. No tenderness.  No edema to lower extremities.          Assessment & Plan:  Dysphagia. EGD/ED. Hx of Schatzki. The risks of bleeding, perforation and infection were reviewed with patient. Will get colonoscopy report from 2008 to see what type of polyp he had.

## 2017-11-27 NOTE — Addendum Note (Signed)
Addended by: Butch Penny on: 11/27/2017 03:25 PM   Modules accepted: Orders, SmartSet

## 2017-12-11 DIAGNOSIS — I10 Essential (primary) hypertension: Secondary | ICD-10-CM | POA: Diagnosis not present

## 2017-12-11 DIAGNOSIS — N401 Enlarged prostate with lower urinary tract symptoms: Secondary | ICD-10-CM | POA: Diagnosis not present

## 2017-12-11 DIAGNOSIS — N138 Other obstructive and reflux uropathy: Secondary | ICD-10-CM | POA: Diagnosis not present

## 2017-12-11 DIAGNOSIS — M545 Low back pain: Secondary | ICD-10-CM | POA: Diagnosis not present

## 2017-12-21 DIAGNOSIS — E782 Mixed hyperlipidemia: Secondary | ICD-10-CM | POA: Diagnosis not present

## 2017-12-21 DIAGNOSIS — R7301 Impaired fasting glucose: Secondary | ICD-10-CM | POA: Diagnosis not present

## 2017-12-21 DIAGNOSIS — D519 Vitamin B12 deficiency anemia, unspecified: Secondary | ICD-10-CM | POA: Diagnosis not present

## 2017-12-21 DIAGNOSIS — K219 Gastro-esophageal reflux disease without esophagitis: Secondary | ICD-10-CM | POA: Diagnosis not present

## 2017-12-25 DIAGNOSIS — E782 Mixed hyperlipidemia: Secondary | ICD-10-CM | POA: Diagnosis not present

## 2017-12-25 DIAGNOSIS — R112 Nausea with vomiting, unspecified: Secondary | ICD-10-CM | POA: Diagnosis not present

## 2017-12-25 DIAGNOSIS — I10 Essential (primary) hypertension: Secondary | ICD-10-CM | POA: Diagnosis not present

## 2017-12-25 DIAGNOSIS — K219 Gastro-esophageal reflux disease without esophagitis: Secondary | ICD-10-CM | POA: Diagnosis not present

## 2017-12-28 ENCOUNTER — Telehealth (INDEPENDENT_AMBULATORY_CARE_PROVIDER_SITE_OTHER): Payer: Self-pay | Admitting: *Deleted

## 2017-12-28 NOTE — Telephone Encounter (Signed)
I will contact patient if we get a cancellation

## 2017-12-28 NOTE — Telephone Encounter (Signed)
Dr.Burdine had his nurse to call on his behalf for patient , Travis Page. Travis Page is to have EGD in November. His esophageal pain has worsened and symptoms. Dr.Burdine would like to see if he may get the procedure prior to November.  Patient's number is [phone].

## 2018-01-10 ENCOUNTER — Other Ambulatory Visit: Payer: Self-pay | Admitting: Urology

## 2018-01-10 ENCOUNTER — Ambulatory Visit (HOSPITAL_COMMUNITY)
Admission: RE | Admit: 2018-01-10 | Discharge: 2018-01-10 | Disposition: A | Discharge status: Home / Self Care | Payer: Medicare Other | Source: Ambulatory Visit | Attending: Urology | Admitting: Urology

## 2018-01-10 ENCOUNTER — Ambulatory Visit: Payer: Medicare Other | Admitting: Urology

## 2018-01-10 DIAGNOSIS — R109 Unspecified abdominal pain: Secondary | ICD-10-CM | POA: Insufficient documentation

## 2018-01-10 DIAGNOSIS — K4090 Unilateral inguinal hernia, without obstruction or gangrene, not specified as recurrent: Secondary | ICD-10-CM | POA: Insufficient documentation

## 2018-01-10 DIAGNOSIS — D3502 Benign neoplasm of left adrenal gland: Secondary | ICD-10-CM | POA: Diagnosis not present

## 2018-01-10 DIAGNOSIS — N200 Calculus of kidney: Secondary | ICD-10-CM

## 2018-01-10 DIAGNOSIS — R1084 Generalized abdominal pain: Secondary | ICD-10-CM | POA: Diagnosis not present

## 2018-01-10 DIAGNOSIS — N400 Enlarged prostate without lower urinary tract symptoms: Secondary | ICD-10-CM | POA: Diagnosis not present

## 2018-01-10 DIAGNOSIS — K5730 Diverticulosis of large intestine without perforation or abscess without bleeding: Secondary | ICD-10-CM | POA: Insufficient documentation

## 2018-01-10 DIAGNOSIS — N401 Enlarged prostate with lower urinary tract symptoms: Secondary | ICD-10-CM | POA: Diagnosis not present

## 2018-01-10 DIAGNOSIS — K760 Fatty (change of) liver, not elsewhere classified: Secondary | ICD-10-CM | POA: Insufficient documentation

## 2018-01-22 ENCOUNTER — Ambulatory Visit (HOSPITAL_COMMUNITY)
Admission: RE | Admit: 2018-01-22 | Discharge: 2018-01-22 | Disposition: A | Discharge status: Home / Self Care | Payer: Medicare Other | Source: Ambulatory Visit | Attending: Internal Medicine | Admitting: Internal Medicine

## 2018-01-22 ENCOUNTER — Other Ambulatory Visit: Payer: Self-pay

## 2018-01-22 ENCOUNTER — Encounter (HOSPITAL_COMMUNITY): Payer: Self-pay | Admitting: *Deleted

## 2018-01-22 ENCOUNTER — Encounter (HOSPITAL_COMMUNITY)
Admission: RE | Disposition: A | Discharge status: Home / Self Care | Payer: Self-pay | Source: Ambulatory Visit | Attending: Internal Medicine

## 2018-01-22 DIAGNOSIS — K449 Diaphragmatic hernia without obstruction or gangrene: Secondary | ICD-10-CM | POA: Insufficient documentation

## 2018-01-22 DIAGNOSIS — Z720 Tobacco use: Secondary | ICD-10-CM | POA: Insufficient documentation

## 2018-01-22 DIAGNOSIS — R1310 Dysphagia, unspecified: Secondary | ICD-10-CM

## 2018-01-22 DIAGNOSIS — Z1211 Encounter for screening for malignant neoplasm of colon: Secondary | ICD-10-CM | POA: Insufficient documentation

## 2018-01-22 DIAGNOSIS — K5730 Diverticulosis of large intestine without perforation or abscess without bleeding: Secondary | ICD-10-CM | POA: Insufficient documentation

## 2018-01-22 DIAGNOSIS — Z888 Allergy status to other drugs, medicaments and biological substances status: Secondary | ICD-10-CM | POA: Diagnosis not present

## 2018-01-22 DIAGNOSIS — K210 Gastro-esophageal reflux disease with esophagitis: Secondary | ICD-10-CM | POA: Diagnosis not present

## 2018-01-22 DIAGNOSIS — K222 Esophageal obstruction: Secondary | ICD-10-CM | POA: Insufficient documentation

## 2018-01-22 DIAGNOSIS — I10 Essential (primary) hypertension: Secondary | ICD-10-CM | POA: Insufficient documentation

## 2018-01-22 DIAGNOSIS — Z7982 Long term (current) use of aspirin: Secondary | ICD-10-CM | POA: Insufficient documentation

## 2018-01-22 DIAGNOSIS — Z09 Encounter for follow-up examination after completed treatment for conditions other than malignant neoplasm: Secondary | ICD-10-CM

## 2018-01-22 DIAGNOSIS — Z79899 Other long term (current) drug therapy: Secondary | ICD-10-CM | POA: Insufficient documentation

## 2018-01-22 DIAGNOSIS — E7800 Pure hypercholesterolemia, unspecified: Secondary | ICD-10-CM | POA: Diagnosis not present

## 2018-01-22 DIAGNOSIS — D120 Benign neoplasm of cecum: Secondary | ICD-10-CM | POA: Insufficient documentation

## 2018-01-22 DIAGNOSIS — Z86010 Personal history of colonic polyps: Secondary | ICD-10-CM

## 2018-01-22 DIAGNOSIS — R1314 Dysphagia, pharyngoesophageal phase: Secondary | ICD-10-CM | POA: Insufficient documentation

## 2018-01-22 DIAGNOSIS — K644 Residual hemorrhoidal skin tags: Secondary | ICD-10-CM | POA: Insufficient documentation

## 2018-01-22 DIAGNOSIS — I2510 Atherosclerotic heart disease of native coronary artery without angina pectoris: Secondary | ICD-10-CM | POA: Diagnosis not present

## 2018-01-22 DIAGNOSIS — R1319 Other dysphagia: Secondary | ICD-10-CM

## 2018-01-22 HISTORY — PX: COLONOSCOPY: SHX5424

## 2018-01-22 HISTORY — PX: ESOPHAGOGASTRODUODENOSCOPY: SHX5428

## 2018-01-22 HISTORY — PX: ESOPHAGEAL DILATION: SHX303

## 2018-01-22 HISTORY — PX: POLYPECTOMY: SHX5525

## 2018-01-22 SURGERY — EGD (ESOPHAGOGASTRODUODENOSCOPY)
Anesthesia: Moderate Sedation

## 2018-01-22 MED ORDER — MIDAZOLAM HCL 5 MG/5ML IJ SOLN
INTRAMUSCULAR | Status: AC
  Filled 2018-01-22: qty 10

## 2018-01-22 MED ORDER — PANTOPRAZOLE SODIUM 40 MG PO TBEC: 40.0000 mg | DELAYED_RELEASE_TABLET | Freq: Every day | ORAL | 5 refills | Status: DC

## 2018-01-22 MED ORDER — LIDOCAINE VISCOUS HCL 2 % MT SOLN
OROMUCOSAL | Status: AC
  Filled 2018-01-22: qty 15

## 2018-01-22 MED ORDER — SODIUM CHLORIDE 0.9 % IV SOLN
INTRAVENOUS | Status: DC
  Administered 2018-01-22: 08:00:00 via INTRAVENOUS

## 2018-01-22 MED ORDER — MEPERIDINE HCL 50 MG/ML IJ SOLN
INTRAMUSCULAR | Status: DC | PRN
  Administered 2018-01-22 (×2): 25 mg via INTRAVENOUS

## 2018-01-22 MED ORDER — MEPERIDINE HCL 50 MG/ML IJ SOLN
INTRAMUSCULAR | Status: AC
  Filled 2018-01-22: qty 1

## 2018-01-22 MED ORDER — LIDOCAINE VISCOUS HCL 2 % MT SOLN
OROMUCOSAL | Status: DC | PRN
  Administered 2018-01-22: 4 mL via OROMUCOSAL

## 2018-01-22 MED ORDER — STERILE WATER FOR IRRIGATION IR SOLN
Status: DC | PRN
  Administered 2018-01-22: 08:00:00

## 2018-01-22 MED ORDER — MIDAZOLAM HCL 5 MG/5ML IJ SOLN
INTRAMUSCULAR | Status: DC | PRN
  Administered 2018-01-22: 1 mg via INTRAVENOUS
  Administered 2018-01-22: 2 mg via INTRAVENOUS
  Administered 2018-01-22: 1 mg via INTRAVENOUS
  Administered 2018-01-22: 2 mg via INTRAVENOUS

## 2018-01-22 NOTE — Op Note (Signed)
[facility] Patient Name: Travis Page Procedure Date: 01/22/2018 7:56 AM MRN: [id number] Date of Birth: December 22, 1940 Attending MD: Hildred Laser , MD CSN: [id number] Age: 77 Admit Type: Outpatient Procedure:                Colonoscopy Indications:              High risk colon cancer surveillance: Personal                            history of colonic polyps Providers:                Hildred Laser, MD, Otis Peak B. Sharon Seller, RN, Aram Candela Referring MD:             Curlene Labrum, MD Medicines:                None Complications:            No immediate complications. Estimated Blood Loss:     Estimated blood loss was minimal. Procedure:                After obtaining informed consent, the colonoscope                            was passed under direct vision. Throughout the                            procedure, the patient's blood pressure, pulse, and                            oxygen saturations were monitored continuously. The                            [id number]-H190DL ([id number]) scope was introduced through                            the anus and advanced to the the cecum, identified                            by appendiceal orifice and ileocecal valve. The                            colonoscopy was performed with ease. The patient                            tolerated the procedure well. The quality of the                            bowel preparation was adequate. The ileocecal                            valve, appendiceal orifice, and rectum were  photographed. Scope In: 8:43:56 AM Scope Out: 9:01:30 AM Scope Withdrawal Time: 0 hours 6 minutes 50 seconds  Total Procedure Duration: 0 hours 17 minutes 34 seconds  Findings:      The perianal and digital rectal examinations were normal.      A 5 mm polyp was found in the cecum. The polyp was sessile. The polyp       was removed with a cold snare. Resection and retrieval were  complete.       The pathology specimen was placed into Bottle Number 1.      Scattered medium-mouthed diverticula were found in the sigmoid colon.      External hemorrhoids were found during retroflexion. The hemorrhoids       were medium-sized. Impression:               - One 5 mm polyp in the cecum, removed with a cold                            snare. Resected and retrieved.                           - Diverticulosis in the sigmoid colon.                           - External hemorrhoids. Moderate Sedation:      Moderate (conscious) sedation was administered by the endoscopy nurse       and supervised by the endoscopist. The following parameters were       monitored: oxygen saturation, heart rate, blood pressure, CO2       capnography and response to care. Total physician intraservice time was       21 minutes. Recommendation:           - Patient has a contact number available for                            emergencies. The signs and symptoms of potential                            delayed complications were discussed with the                            patient. Return to normal activities tomorrow.                            Written discharge instructions were provided to the                            patient.                           - High fiber diet today.                           - Continue present medications.                           - Resume aspirin at prior dose tomorrow.                           -  Await pathology results.                           - Repeat colonoscopy is recommended. The                            colonoscopy date will be determined after pathology                            results from today's exam become available for                            review. Procedure Code(s):        --- Professional ---                           [phone], Colonoscopy, flexible; with removal of                            tumor(s), polyp(s), or other lesion(s) by snare                             technique                           G0500, Moderate sedation services provided by the                            same physician or other qualified health care                            professional performing a gastrointestinal                            endoscopic service that sedation supports,                            requiring the presence of an independent trained                            observer to assist in the monitoring of the                            patient's level of consciousness and physiological                            status; initial 15 minutes of intra-service time;                            patient age 5 years or older (additional time may                            be reported with [phone], as appropriate) Diagnosis Code(s):        --- Professional ---  Z86.010, Personal history of colonic polyps                           D12.0, Benign neoplasm of cecum                           K64.4, Residual hemorrhoidal skin tags                           K57.30, Diverticulosis of large intestine without                            perforation or abscess without bleeding CPT copyright 2018 American Medical Association. All rights reserved. The codes documented in this report are preliminary and upon coder review may  be revised to meet current compliance requirements. Hildred Laser, MD Hildred Laser, MD 01/22/2018 9:18:40 AM This report has been signed electronically. Number of Addenda: 0

## 2018-01-22 NOTE — Op Note (Signed)
[facility] Patient Name: Travis Page Procedure Date: 01/22/2018 8:04 AM MRN: [id number] Date of Birth: 02/22/1941 Attending MD: Hildred Laser , MD CSN: [id number] Age: 77 Admit Type: Outpatient Procedure:                Upper GI endoscopy Indications:              Esophageal dysphagia Providers:                Hildred Laser, MD, Jeanann Lewandowsky. Sharon Seller, RN, Aram Candela Referring MD:             Curlene Labrum, MD Medicines:                Lidocaine spray, Meperidine 50 mg IV, Midazolam 6                            mg IV Complications:            No immediate complications. Estimated Blood Loss:     Estimated blood loss was minimal. Procedure:                Pre-Anesthesia Assessment:                           - Prior to the procedure, a History and Physical                            was performed, and patient medications and                            allergies were reviewed. The patient's tolerance of                            previous anesthesia was also reviewed. The risks                            and benefits of the procedure and the sedation                            options and risks were discussed with the patient.                            All questions were answered, and informed consent                            was obtained. Prior Anticoagulants: The patient                            last took aspirin 2 days prior to the procedure.                            ASA Grade Assessment: II - A patient with mild  systemic disease. After reviewing the risks and                            benefits, the patient was deemed in satisfactory                            condition to undergo the procedure.                           After obtaining informed consent, the endoscope was                            passed under direct vision. Throughout the                            procedure, the patient's blood pressure, pulse,  and                            oxygen saturations were monitored continuously. The                            GIF-H190 ([id number]) scope was introduced through the                            mouth, and advanced to the second part of duodenum.                            The upper GI endoscopy was accomplished without                            difficulty. The patient tolerated the procedure                            well. Scope In: 8:28:53 AM Scope Out: 8:40:27 AM Total Procedure Duration: 0 hours 11 minutes 34 seconds  Findings:      The proximal esophagus and mid esophagus were normal.      LA Grade A (one or more mucosal breaks less than 5 mm, not extending       between tops of 2 mucosal folds) esophagitis was found 37 cm from the       incisors.      A moderate Schatzki ring was found at the gastroesophageal junction. The       scope was withdrawn. Dilation was performed with a Maloney dilator with       mild resistance at 37 Fr. The dilation site was examined following       endoscope reinsertion and showed moderate improvement in luminal       narrowing and no perforation.      A 3 cm hiatal hernia was present.      The entire examined stomach was normal.      The duodenal bulb and second portion of the duodenum were normal. Impression:               - Normal proximal esophagus and mid esophagus.                           -  LA Grade A reflux esophagitis.                           - Moderate Schatzki ring. Dilated.                           - 3 cm hiatal hernia.                           - Normal stomach.                           - Normal duodenal bulb and second portion of the                            duodenum.                           - No specimens collected. Moderate Sedation:      Moderate (conscious) sedation was administered by the endoscopy nurse       and supervised by the endoscopist. The following parameters were       monitored: oxygen saturation, heart rate,  blood pressure, CO2       capnography and response to care. Total physician intraservice time was       17 minutes. Recommendation:           - Patient has a contact number available for                            emergencies. The signs and symptoms of potential                            delayed complications were discussed with the                            patient. Return to normal activities tomorrow.                            Written discharge instructions were provided to the                            patient.                           - Resume previous diet today.                           - Continue present medications.                           - Take Pantoprazole 40 mg po qam for three months                            and then qod.                           - Resume aspirin at prior dose tomorrow.  Procedure Code(s):        --- Professional ---                           [phone], Esophagogastroduodenoscopy, flexible,                            transoral; diagnostic, including collection of                            specimen(s) by brushing or washing, when performed                            (separate procedure)                           43450, Dilation of esophagus, by unguided sound or                            bougie, single or multiple passes                           G0500, Moderate sedation services provided by the                            same physician or other qualified health care                            professional performing a gastrointestinal                            endoscopic service that sedation supports,                            requiring the presence of an independent trained                            observer to assist in the monitoring of the                            patient's level of consciousness and physiological                            status; initial 15 minutes of intra-service time;                            patient age 79 years or  older (additional time may                            be reported with [phone], as appropriate) Diagnosis Code(s):        --- Professional ---                           K21.0, Gastro-esophageal reflux disease with  esophagitis                           K22.2, Esophageal obstruction                           K44.9, Diaphragmatic hernia without obstruction or                            gangrene                           R13.14, Dysphagia, pharyngoesophageal phase CPT copyright 2018 American Medical Association. All rights reserved. The codes documented in this report are preliminary and upon coder review may  be revised to meet current compliance requirements. Hildred Laser, MD Hildred Laser, MD 01/22/2018 9:14:07 AM This report has been signed electronically. Number of Addenda: 0

## 2018-01-22 NOTE — Discharge Instructions (Signed)
No aspirin or NSAIDs for 24 hours. Take pantoprazole 40 mg by mouth 30 minutes before breakfast daily for 3 months and thereafter every other day. Resume medications as before. Resume usual diet. No driving for 24 hours. Physician will call with biopsy results. Gastrointestinal Endoscopy, Care After Refer to this sheet in the next few weeks. These instructions provide you with information about caring for yourself after your procedure. Your health care provider may also give you more specific instructions. Your treatment has been planned according to current medical practices, but problems sometimes occur. Call your health care provider if you have any problems or questions after your procedure. What can I expect after the procedure? After your procedure, it is common to feel:  Bloated.  Soreness in your throat.  Sleepy.  Follow these instructions at home:  Do not drive for 24 hours if you received a if you received a medicine to help you relax (sedative).  Avoid drinking warm beverages and alcohol for the first 24 hours after the procedure.  Take over-the-counter and prescription medicines only as told by your health care provider.  Drink enough fluids to keep your urine clear or pale yellow.  If you feel bloated, try going for a walk. Walking may help the feeling go away.  If your throat is sore, try gargling with salt water. Get help right away if:  You have severe nausea or vomiting.  You have severe abdominal pain, abdominal cramps that last longer than 6 hours, or abdominal swelling.  You have severe shoulder or back pain.  You have trouble swallowing.  You have shortness of breath, your breathing is shallow, or you breathing is faster than normal.  You have a fever.  Your heart is beating very fast.  You vomit blood or material that looks like coffee grounds.  You have bloody, black, or tarry stools. This information is not intended to replace advice given to  you by your health care provider. Make sure you discuss any questions you have with your health care provider. Document Released: 10/20/2003 Document Revised: 01/13/2016 Document Reviewed: 12/28/2014 Elsevier Interactive Patient Education  2018 [employer].  Colonoscopy, Adult, Care After This sheet gives you information about how to care for yourself after your procedure. Your health care provider may also give you more specific instructions. If you have problems or questions, contact your health care provider. What can I expect after the procedure? After the procedure, it is common to have:  A small amount of blood in your stool for 24 hours after the procedure.  Some gas.  Mild abdominal cramping or bloating.  Follow these instructions at home: General instructions   For the first 24 hours after the procedure: ? Do not drive or use machinery. ? Do not sign important documents. ? Do not drink alcohol. ? Do your regular daily activities at a slower pace than normal. ? Eat soft, easy-to-digest foods. ? Rest often.  Take over-the-counter or prescription medicines only as told by your health care provider.  It is up to you to get the results of your procedure. Ask your health care provider, or the department performing the procedure, when your results will be ready. Relieving cramping and bloating  Try walking around when you have cramps or feel bloated.  Apply heat to your abdomen as told by your health care provider. Use a heat source that your health care provider recommends, such as a moist heat pack or a heating pad. ? Place a towel between  your skin and the heat source. ? Leave the heat on for 20-30 minutes. ? Remove the heat if your skin turns bright red. This is especially important if you are unable to feel pain, heat, or cold. You may have a greater risk of getting burned. Eating and drinking  Drink enough fluid to keep your urine clear or pale yellow.  Resume your  normal diet as instructed by your health care provider. Avoid heavy or fried foods that are hard to digest.  Avoid drinking alcohol for as long as instructed by your health care provider. Contact a health care provider if:  You have blood in your stool 2-3 days after the procedure. Get help right away if:  You have more than a small spotting of blood in your stool.  You pass large blood clots in your stool.  Your abdomen is swollen.  You have nausea or vomiting.  You have a fever.  You have increasing abdominal pain that is not relieved with medicine. This information is not intended to replace advice given to you by your health care provider. Make sure you discuss any questions you have with your health care provider. Document Released: 10/20/2003 Document Revised: 11/30/2015 Document Reviewed: 05/19/2015 Elsevier Interactive Patient Education  [employer].

## 2018-01-22 NOTE — H&P (Addendum)
Travis Page is an 77 y.o. male.   Chief Complaint: Patient is here for EGD, ED and colonoscopy. HPI: This 77 year old Caucasian male who has history of Schatzki's ring and GERD.  Esophagus was last dilated in September 2017.  He was doing fine until he was advised to stop his medication for GERD and since then he has had dysphagia with solids.  He points to lower sternal area sort of bolus obstruction.  He states since he has gone back on omeprazole swallowing is not as bad. Has a history of colonic adenomas.  Last exam was in 2014.  He denies change in bowel habits or rectal bleeding. Family history is negative for CRC.  Past Medical History:  Diagnosis Date  . Fatigue   . High cholesterol   . Hypertension   . CAD        GERD  Past Surgical History:  Procedure Laterality Date  . CHOLECYSTECTOMY    . ESOPHAGEAL DILATION N/A 12/10/2015   Procedure: ESOPHAGEAL DILATION;  Surgeon: Rogene Houston, MD;  Location: [facility];  Service: Endoscopy;  Laterality: N/A;  . ESOPHAGOGASTRODUODENOSCOPY N/A 12/10/2015   Procedure: ESOPHAGOGASTRODUODENOSCOPY (EGD);  Surgeon: Rogene Houston, MD;  Location: [facility];  Service: Endoscopy;  Laterality: N/A;  . LEFT HEART CATH AND CORONARY ANGIOGRAPHY N/A 12/07/2016   Procedure: LEFT HEART CATH AND CORONARY ANGIOGRAPHY;  Surgeon: Jettie Booze, MD;  Location: [place] [facility];  Service: Cardiovascular;  Laterality: N/A;  . REPLACEMENT TOTAL KNEE     rt knee     Family History  Problem Relation Age of Onset  . Heart attack Mother   . Hypercholesterolemia Mother   . Heart attack Father   . Hypercholesterolemia Sister   . CAD Sister    Social History:  reports that he has never smoked. His smokeless tobacco use includes chew. He reports that he does not drink alcohol or use drugs.  Allergies:  Allergies  Allergen Reactions  . Statins     Muscle aches.    Medications Prior to Admission  Medication Sig Dispense Refill  . aspirin  EC 81 MG tablet Take 81 mg by mouth at bedtime.    . Cholecalciferol (VITAMIN D) 2000 units CAPS Take 2,000 Units by mouth daily at 3 pm.     . cyanocobalamin (,VITAMIN B-12,) 1000 MCG/ML injection Inject 1,000 mcg into the muscle every 30 (thirty) days.    Marland Kitchen dutasteride (AVODART) 0.5 MG capsule Take 0.5 mg by mouth daily at 3 pm.     . fenofibrate 160 MG tablet Take 160 mg by mouth daily.    Marland Kitchen HYDROcodone-acetaminophen (NORCO) 10-325 MG tablet Take 1 tablet by mouth every 6 (six) hours as needed (for pain).     Marland Kitchen losartan (COZAAR) 50 MG tablet Take 50 mg by mouth daily at 3 pm.     . Multiple Vitamin (MULTIVITAMIN WITH MINERALS) TABS tablet Take 1 tablet by mouth daily at 3 pm.     . pantoprazole (PROTONIX) 40 MG tablet Take 40 mg by mouth daily as needed (for acid reflux/indigestion.).     Marland Kitchen zolpidem (AMBIEN) 10 MG tablet Take 10 mg by mouth at bedtime.    . nitroGLYCERIN (NITROSTAT) 0.4 MG SL tablet Place 1 tablet (0.4 mg total) under the tongue every 5 (five) minutes as needed for chest pain. 25 tablet 3    No results found for this or any previous visit (from the past 48 hour(s)). No results found.  ROS  Blood pressure (!) 143/74, pulse 83, temperature 97.7 F (36.5 C), temperature source Oral, resp. rate (!) 9, SpO2 97 %. Physical Exam  Constitutional: He appears well-developed and well-nourished.  HENT:  Mouth/Throat: Oropharynx is clear and moist.  Eyes: Conjunctivae are normal. No scleral icterus.  Neck: No thyromegaly present.  Cardiovascular: Normal rate, regular rhythm and normal heart sounds.  No murmur heard. Respiratory: Effort normal and breath sounds normal.  GI:  Abdomen is full with horizontal scar in right upper quadrant.  He has mild midepigastric tenderness.  No organomegaly or masses.  Musculoskeletal: He exhibits no edema.  Lymphadenopathy:    He has no cervical adenopathy.  Neurological: He is alert.  Skin: Skin is warm and dry.      Assessment/Plan Solid food dysphagia. History of Schatzki's ring and chronic GERD. History of colonic adenomas ED with a ED and surveillance colonoscopy.  Hildred Laser, MD 01/22/2018, 8:19 AM

## 2018-01-23 DIAGNOSIS — D519 Vitamin B12 deficiency anemia, unspecified: Secondary | ICD-10-CM | POA: Diagnosis not present

## 2018-01-24 ENCOUNTER — Encounter (HOSPITAL_COMMUNITY): Payer: Self-pay | Admitting: Internal Medicine

## 2018-01-31 ENCOUNTER — Ambulatory Visit (HOSPITAL_COMMUNITY): Admit: 2018-01-31 | Payer: Medicare Other | Admitting: Internal Medicine

## 2018-01-31 ENCOUNTER — Encounter (HOSPITAL_COMMUNITY): Payer: Self-pay

## 2018-01-31 SURGERY — COLONOSCOPY
Anesthesia: Moderate Sedation

## 2018-02-23 DIAGNOSIS — E539 Vitamin B deficiency, unspecified: Secondary | ICD-10-CM | POA: Diagnosis not present

## 2018-02-26 DIAGNOSIS — I10 Essential (primary) hypertension: Secondary | ICD-10-CM | POA: Diagnosis not present

## 2018-02-26 DIAGNOSIS — M1712 Unilateral primary osteoarthritis, left knee: Secondary | ICD-10-CM | POA: Diagnosis not present

## 2018-02-26 DIAGNOSIS — R7301 Impaired fasting glucose: Secondary | ICD-10-CM | POA: Diagnosis not present

## 2018-02-26 DIAGNOSIS — D126 Benign neoplasm of colon, unspecified: Secondary | ICD-10-CM | POA: Diagnosis not present

## 2018-03-20 DIAGNOSIS — Z888 Allergy status to other drugs, medicaments and biological substances status: Secondary | ICD-10-CM | POA: Diagnosis not present

## 2018-03-20 DIAGNOSIS — Z79899 Other long term (current) drug therapy: Secondary | ICD-10-CM | POA: Diagnosis not present

## 2018-03-20 DIAGNOSIS — R072 Precordial pain: Secondary | ICD-10-CM | POA: Diagnosis not present

## 2018-03-20 DIAGNOSIS — R0781 Pleurodynia: Secondary | ICD-10-CM | POA: Diagnosis not present

## 2018-03-20 DIAGNOSIS — E7800 Pure hypercholesterolemia, unspecified: Secondary | ICD-10-CM | POA: Diagnosis not present

## 2018-03-20 DIAGNOSIS — R109 Unspecified abdominal pain: Secondary | ICD-10-CM | POA: Diagnosis not present

## 2018-03-20 DIAGNOSIS — K219 Gastro-esophageal reflux disease without esophagitis: Secondary | ICD-10-CM | POA: Diagnosis not present

## 2018-03-20 DIAGNOSIS — I10 Essential (primary) hypertension: Secondary | ICD-10-CM | POA: Diagnosis not present

## 2018-03-20 DIAGNOSIS — R0789 Other chest pain: Secondary | ICD-10-CM | POA: Diagnosis not present

## 2018-03-22 DIAGNOSIS — I10 Essential (primary) hypertension: Secondary | ICD-10-CM | POA: Diagnosis not present

## 2018-03-22 DIAGNOSIS — Z6826 Body mass index (BMI) 26.0-26.9, adult: Secondary | ICD-10-CM | POA: Diagnosis not present

## 2018-03-22 DIAGNOSIS — K219 Gastro-esophageal reflux disease without esophagitis: Secondary | ICD-10-CM | POA: Diagnosis not present

## 2018-03-22 DIAGNOSIS — R7301 Impaired fasting glucose: Secondary | ICD-10-CM | POA: Diagnosis not present

## 2018-03-22 DIAGNOSIS — R0781 Pleurodynia: Secondary | ICD-10-CM | POA: Diagnosis not present

## 2018-03-22 DIAGNOSIS — R9439 Abnormal result of other cardiovascular function study: Secondary | ICD-10-CM | POA: Diagnosis not present

## 2018-03-26 DIAGNOSIS — K210 Gastro-esophageal reflux disease with esophagitis: Secondary | ICD-10-CM | POA: Diagnosis not present

## 2018-03-26 DIAGNOSIS — N183 Chronic kidney disease, stage 3 (moderate): Secondary | ICD-10-CM | POA: Diagnosis not present

## 2018-03-26 DIAGNOSIS — R0781 Pleurodynia: Secondary | ICD-10-CM | POA: Diagnosis not present

## 2018-03-26 DIAGNOSIS — K222 Esophageal obstruction: Secondary | ICD-10-CM | POA: Diagnosis not present

## 2018-04-16 ENCOUNTER — Ambulatory Visit (INDEPENDENT_AMBULATORY_CARE_PROVIDER_SITE_OTHER): Payer: Medicare Other | Admitting: Internal Medicine

## 2018-04-16 ENCOUNTER — Telehealth (INDEPENDENT_AMBULATORY_CARE_PROVIDER_SITE_OTHER): Payer: Self-pay | Admitting: Internal Medicine

## 2018-04-16 ENCOUNTER — Encounter (INDEPENDENT_AMBULATORY_CARE_PROVIDER_SITE_OTHER): Payer: Self-pay | Admitting: Internal Medicine

## 2018-04-16 DIAGNOSIS — R1012 Left upper quadrant pain: Secondary | ICD-10-CM | POA: Diagnosis not present

## 2018-04-16 NOTE — Progress Notes (Addendum)
Subjective:    Patient ID: Travis Page, male    DOB: 1940-04-13, 78 y.o.   MRN: [id number]  HPI Presents today with c/o LUQ under his rib case. Seen at [place]. He states he had xrays but no CT.  He states he continues to have the pain. Hurts to take a deep breath and wakes him up at night.  Nothing makes it better or worse. Not related to food intake.  Pain started about 1 week before New Years Eve.  Hurts to take a deep breath.  Appetite is good. No weight loss.  BMs are okay. Not moving like they use to.  Has seen Dr. Pleas Koch for this pain.  Denies any kind of trauma.   Underwent a CT in October of 2019 for left flank pain which was negative for a stone.   Last colonoscopy with 01/22/2018 (personal hx of colonic polyps). Impression:               - One 5 mm polyp in the cecum, removed with a cold                            snare. Resected and retrieved.                           - Diverticulosis in the sigmoid colon.                           - External hemorrhoids.  Review of Systems Past Medical History:  Diagnosis Date  . Fatigue   . High cholesterol   . Hypertension   . Shortness of breath     Past Surgical History:  Procedure Laterality Date  . CHOLECYSTECTOMY    . COLONOSCOPY N/A 01/22/2018   Procedure: COLONOSCOPY;  Surgeon: Rogene Houston, MD;  [facility];  Service: Endoscopy;  Laterality: N/A;  . ESOPHAGEAL DILATION N/A 12/10/2015   Procedure: ESOPHAGEAL DILATION;  Surgeon: Rogene Houston, MD;  [facility];  Service: Endoscopy;  Laterality: N/A;  . ESOPHAGEAL DILATION N/A 01/22/2018   Procedure: ESOPHAGEAL DILATION;  Surgeon: Rogene Houston, MD;  [facility];  Service: Endoscopy;  Laterality: N/A;  . ESOPHAGOGASTRODUODENOSCOPY N/A 12/10/2015   Procedure: ESOPHAGOGASTRODUODENOSCOPY (EGD);  Surgeon: Rogene Houston, MD;  [facility];  Service: Endoscopy;  Laterality: N/A;  . ESOPHAGOGASTRODUODENOSCOPY N/A  01/22/2018   Procedure: ESOPHAGOGASTRODUODENOSCOPY (EGD);  Surgeon: Rogene Houston, MD;  [facility];  Service: Endoscopy;  Laterality: N/A;  12:00-rescheduled 11/4 @ 8:15am per Lelon Frohlich  . LEFT HEART CATH AND CORONARY ANGIOGRAPHY N/A 12/07/2016   Procedure: LEFT HEART CATH AND CORONARY ANGIOGRAPHY;  Surgeon: Jettie Booze, MD;  Location: [facility];  Service: Cardiovascular;  Laterality: N/A;  . POLYPECTOMY  01/22/2018   Procedure: POLYPECTOMY;  Surgeon: Rogene Houston, MD;  [facility];  Service: Endoscopy;;  colon  . REPLACEMENT TOTAL KNEE     rt knee     Allergies  Allergen Reactions  . Statins     Muscle aches.    Current Outpatient Medications on File Prior to Visit  Medication Sig Dispense Refill  . aspirin EC 81 MG tablet Take 1 tablet (81 mg total) by mouth at bedtime.    . cyanocobalamin (,VITAMIN B-12,) 1000 MCG/ML injection Inject 1,000  mcg into the muscle every 30 (thirty) days.    Marland Kitchen dutasteride (AVODART) 0.5 MG capsule Take 0.5 mg by mouth daily at 3 pm.     . fenofibrate 160 MG tablet Take 160 mg by mouth daily.    Marland Kitchen HYDROcodone-acetaminophen (NORCO) 10-325 MG tablet Take 1 tablet by mouth every 6 (six) hours as needed (for pain).     Marland Kitchen losartan (COZAAR) 50 MG tablet Take 50 mg by mouth daily at 3 pm.     . pantoprazole (PROTONIX) 40 MG tablet Take 1 tablet (40 mg total) by mouth daily before breakfast. 30 tablet 5  . zolpidem (AMBIEN) 10 MG tablet Take 10 mg by mouth at bedtime.     No current facility-administered medications on file prior to visit.         Objective:   Physical Exam Blood pressure (!) 142/68, pulse 92, temperature 98.3 F (36.8 C), height 6\' 1"  (1.854 m), weight 193 lb 6.4 oz (87.7 kg). Alert and oriented. Skin warm and dry. Oral mucosa is moist.   . Sclera anicteric, conjunctivae is pink. Thyroid not enlarged. No cervical lymphadenopathy. Lungs clear. Heart regular rate and rhythm.  Abdomen is soft. Bowel sounds  are positive. No hepatomegaly. No abdominal masses felt Tenderness.LUQ.  No edema to lower extremities.   an         Assessment & Plan:  LUQ pain. Will get a lipase, Amlase, CBC, CMEt, US abdomen.  Further recommendations to follow.

## 2018-04-16 NOTE — Telephone Encounter (Signed)
Please call [facility] hospital and get xray report he had this month. Thanks.

## 2018-04-16 NOTE — Patient Instructions (Signed)
CT and Labs.

## 2018-04-16 NOTE — Telephone Encounter (Signed)
Order is in.

## 2018-04-16 NOTE — Telephone Encounter (Signed)
[place] sch'd 04/18/18 at 830 (815), npo after midnight, patient aware

## 2018-04-16 NOTE — Addendum Note (Signed)
Addended by: Butch Penny on: 04/16/2018 11:55 AM   Modules accepted: Orders

## 2018-04-17 LAB — CBC WITH DIFFERENTIAL/PLATELET
Absolute Monocytes: 585 cells/uL (ref 200–950)
BASOS PCT: 0.4 %
Basophils Absolute: 31 cells/uL (ref 0–200)
EOS PCT: 2.2 %
Eosinophils Absolute: 169 cells/uL (ref 15–500)
HCT: 41.8 % (ref 38.5–50.0)
HEMOGLOBIN: 13.8 g/dL (ref 13.2–17.1)
Lymphs Abs: 1571 cells/uL (ref 850–3900)
MCH: 28.4 pg (ref 27.0–33.0)
MCHC: 33 g/dL (ref 32.0–36.0)
MCV: 86 fL (ref 80.0–100.0)
MONOS PCT: 7.6 %
MPV: 10.1 fL (ref 7.5–12.5)
NEUTROS ABS: 5344 {cells}/uL (ref 1500–7800)
Neutrophils Relative %: 69.4 %
Platelets: 420 10*3/uL — ABNORMAL HIGH (ref 140–400)
RBC: 4.86 10*6/uL (ref 4.20–5.80)
RDW: 12.7 % (ref 11.0–15.0)
Total Lymphocyte: 20.4 %
WBC: 7.7 10*3/uL (ref 3.8–10.8)

## 2018-04-17 LAB — AMYLASE: Amylase: 32 U/L (ref 21–101)

## 2018-04-17 LAB — LIPASE: Lipase: 25 U/L (ref 7–60)

## 2018-04-17 LAB — COMPREHENSIVE METABOLIC PANEL
AG RATIO: 1.6 (calc) (ref 1.0–2.5)
ALT: 10 U/L (ref 9–46)
AST: 12 U/L (ref 10–35)
Albumin: 4.6 g/dL (ref 3.6–5.1)
Alkaline phosphatase (APISO): 48 U/L (ref 40–115)
BILIRUBIN TOTAL: 0.5 mg/dL (ref 0.2–1.2)
BUN: 15 mg/dL (ref 7–25)
CALCIUM: 10.1 mg/dL (ref 8.6–10.3)
CHLORIDE: 105 mmol/L (ref 98–110)
CO2: 28 mmol/L (ref 20–32)
Creat: 1.11 mg/dL (ref 0.70–1.18)
GLOBULIN: 2.8 g/dL (ref 1.9–3.7)
GLUCOSE: 90 mg/dL (ref 65–139)
Potassium: 4.5 mmol/L (ref 3.5–5.3)
SODIUM: 140 mmol/L (ref 135–146)
Total Protein: 7.4 g/dL (ref 6.1–8.1)

## 2018-04-18 ENCOUNTER — Ambulatory Visit (HOSPITAL_COMMUNITY): Payer: Medicare Other

## 2018-04-19 ENCOUNTER — Ambulatory Visit (HOSPITAL_COMMUNITY)
Admission: RE | Admit: 2018-04-19 | Discharge: 2018-04-19 | Disposition: A | Discharge status: Home / Self Care | Payer: Medicare Other | Source: Ambulatory Visit | Attending: Internal Medicine | Admitting: Internal Medicine

## 2018-04-19 DIAGNOSIS — R1012 Left upper quadrant pain: Secondary | ICD-10-CM

## 2018-04-19 DIAGNOSIS — N281 Cyst of kidney, acquired: Secondary | ICD-10-CM | POA: Diagnosis not present

## 2018-04-19 DIAGNOSIS — K760 Fatty (change of) liver, not elsewhere classified: Secondary | ICD-10-CM | POA: Diagnosis not present

## 2018-04-30 ENCOUNTER — Telehealth (INDEPENDENT_AMBULATORY_CARE_PROVIDER_SITE_OTHER): Payer: Self-pay | Admitting: Internal Medicine

## 2018-04-30 NOTE — Telephone Encounter (Signed)
No answer. Will try again later. 

## 2018-04-30 NOTE — Telephone Encounter (Signed)
Patients wife called regarding test results  -  please call [phone]

## 2018-05-03 NOTE — Telephone Encounter (Signed)
No answer. I have tried call patient multiple times.

## 2018-05-15 ENCOUNTER — Encounter (INDEPENDENT_AMBULATORY_CARE_PROVIDER_SITE_OTHER): Payer: Self-pay | Admitting: *Deleted

## 2018-05-22 DIAGNOSIS — D519 Vitamin B12 deficiency anemia, unspecified: Secondary | ICD-10-CM | POA: Diagnosis not present

## 2018-05-28 DIAGNOSIS — H35033 Hypertensive retinopathy, bilateral: Secondary | ICD-10-CM | POA: Diagnosis not present

## 2018-05-29 ENCOUNTER — Telehealth (INDEPENDENT_AMBULATORY_CARE_PROVIDER_SITE_OTHER): Payer: Self-pay | Admitting: Internal Medicine

## 2018-05-29 ENCOUNTER — Encounter (INDEPENDENT_AMBULATORY_CARE_PROVIDER_SITE_OTHER): Payer: Self-pay | Admitting: Internal Medicine

## 2018-05-29 ENCOUNTER — Ambulatory Visit (INDEPENDENT_AMBULATORY_CARE_PROVIDER_SITE_OTHER): Payer: Medicare Other | Admitting: Internal Medicine

## 2018-05-29 ENCOUNTER — Telehealth (INDEPENDENT_AMBULATORY_CARE_PROVIDER_SITE_OTHER): Payer: Self-pay | Admitting: *Deleted

## 2018-05-29 DIAGNOSIS — R1011 Right upper quadrant pain: Secondary | ICD-10-CM

## 2018-05-29 NOTE — Telephone Encounter (Signed)
err

## 2018-05-29 NOTE — Progress Notes (Signed)
Subjective:    Patient ID: Travis Page, male    DOB: 1940-12-10, 78 y.o.   MRN: [id number]  HPI Here today for f/u. Last seen 04/16/2018 with LUQ pain under his ribs. Seen at [facility]. States he woke up about 3 weeks ago and the pain was completely gone. He now has pain in his RUQ. Hurts to take a deep breath.   Amylase, Lipase, CBC, and CMET were normal.   Has had pain RUQ for about 3 weeks. Hurts worse lying down. No injury. No falls. No fever. No nausea or vomiting.  [place] 04/19/2018 was normal.  His appetite is good. He has gained almost 2 pound since his last visit. Daughter states he really has not been eating as usual.  Recently underwent an EGD and colonoscopy in November of 2019.  Last colonoscopy with 01/22/2018 (personal hx of colonic polyps). Impression: - One 5 mm polyp in the cecum, removed with a cold  snare. Resected and retrieved. - Diverticulosis in the sigmoid colon. - External hemorrhoids. EGD 01/2018 Dysphagia: Normal proximal esophagus and mid esophagus. LA Grade A reflux esophagitis. Moderate Schatzki ring. Dilated.  3 cm hiatal hernia. Normal stomach. Normal duodenal bulb and second portion of the duodenum.   Review of Systems  Past Medical History:  Diagnosis Date  . Fatigue   . High cholesterol   . Hypertension   . Shortness of breath     Past Surgical History:  Procedure Laterality Date  . CHOLECYSTECTOMY    . COLONOSCOPY N/A 01/22/2018   Procedure: COLONOSCOPY;  Surgeon: Rogene Houston, MD;  [facility];  Service: Endoscopy;  Laterality: N/A;  . ESOPHAGEAL DILATION N/A 12/10/2015   Procedure: ESOPHAGEAL DILATION;  Surgeon: Rogene Houston, MD;  [facility];  Service: Endoscopy;  Laterality: N/A;  . ESOPHAGEAL DILATION N/A 01/22/2018   Procedure: ESOPHAGEAL DILATION;  Surgeon: Rogene Houston, MD;  [facility];  Service:  Endoscopy;  Laterality: N/A;  . ESOPHAGOGASTRODUODENOSCOPY N/A 12/10/2015   Procedure: ESOPHAGOGASTRODUODENOSCOPY (EGD);  Surgeon: Rogene Houston, MD;  [facility];  Service: Endoscopy;  Laterality: N/A;  . ESOPHAGOGASTRODUODENOSCOPY N/A 01/22/2018   Procedure: ESOPHAGOGASTRODUODENOSCOPY (EGD);  Surgeon: Rogene Houston, MD;  [facility];  Service: Endoscopy;  Laterality: N/A;  12:00-rescheduled 11/4 @ 8:15am per Lelon Frohlich  . LEFT HEART CATH AND CORONARY ANGIOGRAPHY N/A 12/07/2016   Procedure: LEFT HEART CATH AND CORONARY ANGIOGRAPHY;  Surgeon: Jettie Booze, MD;  Location: [facility];  Service: Cardiovascular;  Laterality: N/A;  . POLYPECTOMY  01/22/2018   Procedure: POLYPECTOMY;  Surgeon: Rogene Houston, MD;  [facility];  Service: Endoscopy;;  colon  . REPLACEMENT TOTAL KNEE     rt knee     Allergies  Allergen Reactions  . Statins     Muscle aches.    Current Outpatient Medications on File Prior to Visit  Medication Sig Dispense Refill  . aspirin EC 81 MG tablet Take 1 tablet (81 mg total) by mouth at bedtime.    . cyanocobalamin (,VITAMIN B-12,) 1000 MCG/ML injection Inject 1,000 mcg into the muscle every 30 (thirty) days.    Marland Kitchen dutasteride (AVODART) 0.5 MG capsule Take 0.5 mg by mouth daily at 3 pm.     . fenofibrate 160 MG tablet Take 160 mg by mouth daily.    Marland Kitchen HYDROcodone-acetaminophen (NORCO) 10-325 MG tablet Take 1 tablet by mouth every 6 (six) hours as  needed (for pain).     Marland Kitchen losartan (COZAAR) 50 MG tablet Take 50 mg by mouth daily at 3 pm.     . pantoprazole (PROTONIX) 40 MG tablet Take 1 tablet (40 mg total) by mouth daily before breakfast. 30 tablet 5  . zolpidem (AMBIEN) 10 MG tablet Take 10 mg by mouth at bedtime.     No current facility-administered medications on file prior to visit.         Objective:   Physical Exam Blood pressure 108/62, pulse (!) 112, temperature 98.3 F (36.8 C), height 6\' 1"  (1.854 m), weight  194 lb 14.4 oz (88.4 kg).  Alert and oriented. Skin warm and dry. Oral mucosa is moist.   . Sclera anicteric, conjunctivae is pink. Thyroid not enlarged. No cervical lymphadenopathy. Lungs clear. Heart regular rate and rhythm.  Abdomen is soft. Bowel sounds are positive. No hepatomegaly. No abdominal masses felt. No tenderness.  No edema to lower extremities.            Assessment & Plan:  RUQ pain. Am going to get a CT abdomen with CM. Liver enzymes are normal.  Further recommendations to follow.

## 2018-05-29 NOTE — Telephone Encounter (Signed)
Patient needs order for Creatinine for CT sch'd 06/12/18 -- he is aware you will put order in and he'll go sometime Thursday or Friday or next week

## 2018-05-30 ENCOUNTER — Telehealth (INDEPENDENT_AMBULATORY_CARE_PROVIDER_SITE_OTHER): Payer: Self-pay | Admitting: Internal Medicine

## 2018-05-30 ENCOUNTER — Other Ambulatory Visit (INDEPENDENT_AMBULATORY_CARE_PROVIDER_SITE_OTHER): Payer: Self-pay | Admitting: Internal Medicine

## 2018-05-30 DIAGNOSIS — R1011 Right upper quadrant pain: Secondary | ICD-10-CM

## 2018-05-30 NOTE — Telephone Encounter (Signed)
Order for creatinine is in

## 2018-06-04 DIAGNOSIS — Z6825 Body mass index (BMI) 25.0-25.9, adult: Secondary | ICD-10-CM | POA: Diagnosis not present

## 2018-06-04 DIAGNOSIS — R1011 Right upper quadrant pain: Secondary | ICD-10-CM | POA: Diagnosis not present

## 2018-06-04 DIAGNOSIS — J320 Chronic maxillary sinusitis: Secondary | ICD-10-CM | POA: Diagnosis not present

## 2018-06-05 LAB — CREATININE, SERUM: Creat: 1.22 mg/dL — ABNORMAL HIGH (ref 0.70–1.18)

## 2018-06-06 DIAGNOSIS — H66001 Acute suppurative otitis media without spontaneous rupture of ear drum, right ear: Secondary | ICD-10-CM | POA: Diagnosis not present

## 2018-06-06 DIAGNOSIS — Z6825 Body mass index (BMI) 25.0-25.9, adult: Secondary | ICD-10-CM | POA: Diagnosis not present

## 2018-06-12 ENCOUNTER — Ambulatory Visit (HOSPITAL_COMMUNITY)
Admission: RE | Admit: 2018-06-12 | Discharge: 2018-06-12 | Disposition: A | Discharge status: Home / Self Care | Payer: Medicare Other | Source: Ambulatory Visit | Attending: Internal Medicine | Admitting: Internal Medicine

## 2018-06-12 ENCOUNTER — Other Ambulatory Visit: Payer: Self-pay

## 2018-06-12 DIAGNOSIS — R1011 Right upper quadrant pain: Secondary | ICD-10-CM | POA: Diagnosis not present

## 2018-06-12 DIAGNOSIS — R1010 Upper abdominal pain, unspecified: Secondary | ICD-10-CM | POA: Diagnosis not present

## 2018-06-12 MED ORDER — IOHEXOL 300 MG/ML  SOLN
100.0000 mL | Freq: Once | INTRAMUSCULAR | Status: AC | PRN
  Administered 2018-06-12: 100 mL via INTRAVENOUS

## 2018-06-20 DIAGNOSIS — I10 Essential (primary) hypertension: Secondary | ICD-10-CM | POA: Diagnosis not present

## 2018-06-20 DIAGNOSIS — R9439 Abnormal result of other cardiovascular function study: Secondary | ICD-10-CM | POA: Diagnosis not present

## 2018-06-20 DIAGNOSIS — N183 Chronic kidney disease, stage 3 (moderate): Secondary | ICD-10-CM | POA: Diagnosis not present

## 2018-06-20 DIAGNOSIS — K222 Esophageal obstruction: Secondary | ICD-10-CM | POA: Diagnosis not present

## 2018-06-22 DIAGNOSIS — J320 Chronic maxillary sinusitis: Secondary | ICD-10-CM | POA: Diagnosis not present

## 2018-06-26 DIAGNOSIS — I10 Essential (primary) hypertension: Secondary | ICD-10-CM | POA: Diagnosis not present

## 2018-06-26 DIAGNOSIS — R7301 Impaired fasting glucose: Secondary | ICD-10-CM | POA: Diagnosis not present

## 2018-06-26 DIAGNOSIS — N183 Chronic kidney disease, stage 3 (moderate): Secondary | ICD-10-CM | POA: Diagnosis not present

## 2018-06-26 DIAGNOSIS — K222 Esophageal obstruction: Secondary | ICD-10-CM | POA: Diagnosis not present

## 2018-07-08 DIAGNOSIS — Z79899 Other long term (current) drug therapy: Secondary | ICD-10-CM | POA: Diagnosis not present

## 2018-07-08 DIAGNOSIS — R070 Pain in throat: Secondary | ICD-10-CM | POA: Diagnosis not present

## 2018-07-08 DIAGNOSIS — R0789 Other chest pain: Secondary | ICD-10-CM | POA: Diagnosis not present

## 2018-07-08 DIAGNOSIS — Z87891 Personal history of nicotine dependence: Secondary | ICD-10-CM | POA: Diagnosis not present

## 2018-07-08 DIAGNOSIS — R072 Precordial pain: Secondary | ICD-10-CM | POA: Diagnosis not present

## 2018-07-08 DIAGNOSIS — I10 Essential (primary) hypertension: Secondary | ICD-10-CM | POA: Diagnosis not present

## 2018-07-08 DIAGNOSIS — R918 Other nonspecific abnormal finding of lung field: Secondary | ICD-10-CM | POA: Diagnosis not present

## 2018-07-08 DIAGNOSIS — M542 Cervicalgia: Secondary | ICD-10-CM | POA: Diagnosis not present

## 2018-07-12 DIAGNOSIS — N183 Chronic kidney disease, stage 3 (moderate): Secondary | ICD-10-CM | POA: Diagnosis not present

## 2018-07-12 DIAGNOSIS — R0600 Dyspnea, unspecified: Secondary | ICD-10-CM | POA: Diagnosis not present

## 2018-07-12 DIAGNOSIS — I10 Essential (primary) hypertension: Secondary | ICD-10-CM | POA: Diagnosis not present

## 2018-07-12 DIAGNOSIS — R079 Chest pain, unspecified: Secondary | ICD-10-CM | POA: Diagnosis not present

## 2018-07-13 ENCOUNTER — Ambulatory Visit: Payer: Medicare Other | Admitting: Cardiology

## 2018-07-17 ENCOUNTER — Encounter: Payer: Self-pay | Admitting: Cardiology

## 2018-07-17 DIAGNOSIS — I700 Atherosclerosis of aorta: Secondary | ICD-10-CM | POA: Diagnosis not present

## 2018-07-17 DIAGNOSIS — B9689 Other specified bacterial agents as the cause of diseases classified elsewhere: Secondary | ICD-10-CM | POA: Diagnosis not present

## 2018-07-17 DIAGNOSIS — K219 Gastro-esophageal reflux disease without esophagitis: Secondary | ICD-10-CM | POA: Diagnosis not present

## 2018-07-17 DIAGNOSIS — R911 Solitary pulmonary nodule: Secondary | ICD-10-CM | POA: Diagnosis not present

## 2018-07-17 DIAGNOSIS — M940 Chondrocostal junction syndrome [Tietze]: Secondary | ICD-10-CM | POA: Diagnosis not present

## 2018-07-17 DIAGNOSIS — J9811 Atelectasis: Secondary | ICD-10-CM | POA: Diagnosis not present

## 2018-07-17 DIAGNOSIS — I313 Pericardial effusion (noninflammatory): Secondary | ICD-10-CM | POA: Diagnosis not present

## 2018-07-17 DIAGNOSIS — R079 Chest pain, unspecified: Secondary | ICD-10-CM | POA: Diagnosis not present

## 2018-07-17 DIAGNOSIS — J209 Acute bronchitis, unspecified: Secondary | ICD-10-CM | POA: Diagnosis not present

## 2018-07-17 DIAGNOSIS — I10 Essential (primary) hypertension: Secondary | ICD-10-CM | POA: Diagnosis not present

## 2018-07-17 DIAGNOSIS — J90 Pleural effusion, not elsewhere classified: Secondary | ICD-10-CM | POA: Diagnosis not present

## 2018-07-17 DIAGNOSIS — S2341XA Sprain of ribs, initial encounter: Secondary | ICD-10-CM | POA: Diagnosis not present

## 2018-07-26 DIAGNOSIS — Z6825 Body mass index (BMI) 25.0-25.9, adult: Secondary | ICD-10-CM | POA: Diagnosis not present

## 2018-07-26 DIAGNOSIS — E539 Vitamin B deficiency, unspecified: Secondary | ICD-10-CM | POA: Diagnosis not present

## 2018-07-30 DIAGNOSIS — R630 Anorexia: Secondary | ICD-10-CM | POA: Diagnosis not present

## 2018-07-30 DIAGNOSIS — K219 Gastro-esophageal reflux disease without esophagitis: Secondary | ICD-10-CM | POA: Diagnosis not present

## 2018-07-30 DIAGNOSIS — K222 Esophageal obstruction: Secondary | ICD-10-CM | POA: Diagnosis not present

## 2018-07-30 DIAGNOSIS — R0600 Dyspnea, unspecified: Secondary | ICD-10-CM | POA: Diagnosis not present

## 2018-08-02 ENCOUNTER — Ambulatory Visit: Payer: Medicare Other | Admitting: Cardiology

## 2018-08-15 ENCOUNTER — Other Ambulatory Visit: Payer: Self-pay

## 2018-08-15 ENCOUNTER — Inpatient Hospital Stay (HOSPITAL_COMMUNITY)
Admission: EM | Admit: 2018-08-15 | Discharge: 2018-08-20 | DRG: 369 | Disposition: A | Discharge status: Home / Self Care | Payer: Medicare Other | Attending: Internal Medicine | Admitting: Internal Medicine

## 2018-08-15 ENCOUNTER — Emergency Department (HOSPITAL_COMMUNITY): Payer: Medicare Other

## 2018-08-15 ENCOUNTER — Encounter (HOSPITAL_COMMUNITY): Payer: Self-pay | Admitting: Emergency Medicine

## 2018-08-15 DIAGNOSIS — R000 Tachycardia, unspecified: Secondary | ICD-10-CM | POA: Diagnosis not present

## 2018-08-15 DIAGNOSIS — K222 Esophageal obstruction: Secondary | ICD-10-CM | POA: Diagnosis not present

## 2018-08-15 DIAGNOSIS — K5900 Constipation, unspecified: Secondary | ICD-10-CM | POA: Diagnosis not present

## 2018-08-15 DIAGNOSIS — Z79899 Other long term (current) drug therapy: Secondary | ICD-10-CM | POA: Diagnosis not present

## 2018-08-15 DIAGNOSIS — D509 Iron deficiency anemia, unspecified: Secondary | ICD-10-CM | POA: Diagnosis present

## 2018-08-15 DIAGNOSIS — Z8719 Personal history of other diseases of the digestive system: Secondary | ICD-10-CM

## 2018-08-15 DIAGNOSIS — R3912 Poor urinary stream: Secondary | ICD-10-CM | POA: Diagnosis present

## 2018-08-15 DIAGNOSIS — Z86010 Personal history of colonic polyps: Secondary | ICD-10-CM

## 2018-08-15 DIAGNOSIS — K219 Gastro-esophageal reflux disease without esophagitis: Secondary | ICD-10-CM | POA: Diagnosis not present

## 2018-08-15 DIAGNOSIS — B370 Candidal stomatitis: Secondary | ICD-10-CM | POA: Diagnosis not present

## 2018-08-15 DIAGNOSIS — R0602 Shortness of breath: Secondary | ICD-10-CM | POA: Diagnosis not present

## 2018-08-15 DIAGNOSIS — R3911 Hesitancy of micturition: Secondary | ICD-10-CM | POA: Diagnosis not present

## 2018-08-15 DIAGNOSIS — H9190 Unspecified hearing loss, unspecified ear: Secondary | ICD-10-CM | POA: Diagnosis not present

## 2018-08-15 DIAGNOSIS — I472 Ventricular tachycardia: Secondary | ICD-10-CM | POA: Diagnosis not present

## 2018-08-15 DIAGNOSIS — R0600 Dyspnea, unspecified: Secondary | ICD-10-CM | POA: Diagnosis not present

## 2018-08-15 DIAGNOSIS — Z8249 Family history of ischemic heart disease and other diseases of the circulatory system: Secondary | ICD-10-CM | POA: Diagnosis not present

## 2018-08-15 DIAGNOSIS — E860 Dehydration: Secondary | ICD-10-CM | POA: Diagnosis not present

## 2018-08-15 DIAGNOSIS — N401 Enlarged prostate with lower urinary tract symptoms: Secondary | ICD-10-CM | POA: Diagnosis not present

## 2018-08-15 DIAGNOSIS — Z7982 Long term (current) use of aspirin: Secondary | ICD-10-CM | POA: Diagnosis not present

## 2018-08-15 DIAGNOSIS — R3129 Other microscopic hematuria: Secondary | ICD-10-CM | POA: Diagnosis present

## 2018-08-15 DIAGNOSIS — R319 Hematuria, unspecified: Secondary | ICD-10-CM | POA: Diagnosis not present

## 2018-08-15 DIAGNOSIS — R079 Chest pain, unspecified: Secondary | ICD-10-CM | POA: Diagnosis not present

## 2018-08-15 DIAGNOSIS — K2970 Gastritis, unspecified, without bleeding: Secondary | ICD-10-CM | POA: Diagnosis not present

## 2018-08-15 DIAGNOSIS — R509 Fever, unspecified: Secondary | ICD-10-CM | POA: Diagnosis not present

## 2018-08-15 DIAGNOSIS — I10 Essential (primary) hypertension: Secondary | ICD-10-CM | POA: Diagnosis present

## 2018-08-15 DIAGNOSIS — D72829 Elevated white blood cell count, unspecified: Secondary | ICD-10-CM | POA: Diagnosis not present

## 2018-08-15 DIAGNOSIS — N179 Acute kidney failure, unspecified: Secondary | ICD-10-CM | POA: Diagnosis not present

## 2018-08-15 DIAGNOSIS — Z20828 Contact with and (suspected) exposure to other viral communicable diseases: Secondary | ICD-10-CM | POA: Diagnosis present

## 2018-08-15 DIAGNOSIS — R630 Anorexia: Secondary | ICD-10-CM | POA: Diagnosis not present

## 2018-08-15 DIAGNOSIS — Z87891 Personal history of nicotine dependence: Secondary | ICD-10-CM | POA: Diagnosis not present

## 2018-08-15 DIAGNOSIS — Z888 Allergy status to other drugs, medicaments and biological substances status: Secondary | ICD-10-CM

## 2018-08-15 DIAGNOSIS — Z96651 Presence of right artificial knee joint: Secondary | ICD-10-CM | POA: Diagnosis present

## 2018-08-15 DIAGNOSIS — Z9049 Acquired absence of other specified parts of digestive tract: Secondary | ICD-10-CM

## 2018-08-15 DIAGNOSIS — Z8349 Family history of other endocrine, nutritional and metabolic diseases: Secondary | ICD-10-CM | POA: Diagnosis not present

## 2018-08-15 DIAGNOSIS — R627 Adult failure to thrive: Secondary | ICD-10-CM | POA: Diagnosis present

## 2018-08-15 DIAGNOSIS — K5909 Other constipation: Secondary | ICD-10-CM | POA: Diagnosis present

## 2018-08-15 DIAGNOSIS — J3489 Other specified disorders of nose and nasal sinuses: Secondary | ICD-10-CM | POA: Diagnosis not present

## 2018-08-15 DIAGNOSIS — B3781 Candidal esophagitis: Principal | ICD-10-CM | POA: Diagnosis present

## 2018-08-15 DIAGNOSIS — E7800 Pure hypercholesterolemia, unspecified: Secondary | ICD-10-CM | POA: Diagnosis not present

## 2018-08-15 DIAGNOSIS — K5730 Diverticulosis of large intestine without perforation or abscess without bleeding: Secondary | ICD-10-CM | POA: Diagnosis not present

## 2018-08-15 DIAGNOSIS — R05 Cough: Secondary | ICD-10-CM | POA: Diagnosis not present

## 2018-08-15 DIAGNOSIS — E785 Hyperlipidemia, unspecified: Secondary | ICD-10-CM | POA: Diagnosis present

## 2018-08-15 DIAGNOSIS — G9389 Other specified disorders of brain: Secondary | ICD-10-CM | POA: Diagnosis not present

## 2018-08-15 DIAGNOSIS — R634 Abnormal weight loss: Secondary | ICD-10-CM | POA: Diagnosis not present

## 2018-08-15 DIAGNOSIS — R110 Nausea: Secondary | ICD-10-CM | POA: Diagnosis not present

## 2018-08-15 DIAGNOSIS — N400 Enlarged prostate without lower urinary tract symptoms: Secondary | ICD-10-CM | POA: Diagnosis not present

## 2018-08-15 DIAGNOSIS — R0609 Other forms of dyspnea: Secondary | ICD-10-CM | POA: Diagnosis present

## 2018-08-15 DIAGNOSIS — I2510 Atherosclerotic heart disease of native coronary artery without angina pectoris: Secondary | ICD-10-CM | POA: Diagnosis present

## 2018-08-15 DIAGNOSIS — J029 Acute pharyngitis, unspecified: Secondary | ICD-10-CM | POA: Diagnosis not present

## 2018-08-15 LAB — URINALYSIS, ROUTINE W REFLEX MICROSCOPIC
Bacteria, UA: NONE SEEN
Bilirubin Urine: NEGATIVE
Glucose, UA: NEGATIVE mg/dL
Ketones, ur: NEGATIVE mg/dL
Leukocytes,Ua: NEGATIVE
Nitrite: NEGATIVE
Protein, ur: 30 mg/dL — AB
Specific Gravity, Urine: 1.014 (ref 1.005–1.030)
pH: 5 (ref 5.0–8.0)

## 2018-08-15 LAB — CBC WITH DIFFERENTIAL/PLATELET
Abs Immature Granulocytes: 0.25 10*3/uL — ABNORMAL HIGH (ref 0.00–0.07)
Basophils Absolute: 0.1 10*3/uL (ref 0.0–0.1)
Basophils Relative: 1 %
Eosinophils Absolute: 0.2 10*3/uL (ref 0.0–0.5)
Eosinophils Relative: 1 %
HCT: 33.8 % — ABNORMAL LOW (ref 39.0–52.0)
Hemoglobin: 10.5 g/dL — ABNORMAL LOW (ref 13.0–17.0)
Immature Granulocytes: 2 %
Lymphocytes Relative: 12 %
Lymphs Abs: 1.5 10*3/uL (ref 0.7–4.0)
MCH: 24.9 pg — ABNORMAL LOW (ref 26.0–34.0)
MCHC: 31.1 g/dL (ref 30.0–36.0)
MCV: 80.1 fL (ref 80.0–100.0)
Monocytes Absolute: 1.1 10*3/uL — ABNORMAL HIGH (ref 0.1–1.0)
Monocytes Relative: 9 %
Neutro Abs: 9.5 10*3/uL — ABNORMAL HIGH (ref 1.7–7.7)
Neutrophils Relative %: 75 %
Platelets: 641 10*3/uL — ABNORMAL HIGH (ref 150–400)
RBC: 4.22 MIL/uL (ref 4.22–5.81)
RDW: 14.1 % (ref 11.5–15.5)
WBC: 12.6 10*3/uL — ABNORMAL HIGH (ref 4.0–10.5)
nRBC: 0 % (ref 0.0–0.2)

## 2018-08-15 LAB — COMPREHENSIVE METABOLIC PANEL
ALT: 10 U/L (ref 0–44)
AST: 10 U/L — ABNORMAL LOW (ref 15–41)
Albumin: 3 g/dL — ABNORMAL LOW (ref 3.5–5.0)
Alkaline Phosphatase: 53 U/L (ref 38–126)
Anion gap: 11 (ref 5–15)
BUN: 13 mg/dL (ref 8–23)
CO2: 24 mmol/L (ref 22–32)
Calcium: 9.6 mg/dL (ref 8.9–10.3)
Chloride: 103 mmol/L (ref 98–111)
Creatinine, Ser: 1.56 mg/dL — ABNORMAL HIGH (ref 0.61–1.24)
GFR calc Af Amer: 48 mL/min — ABNORMAL LOW (ref >60)
GFR calc non Af Amer: 42 mL/min — ABNORMAL LOW (ref >60)
Glucose, Bld: 105 mg/dL — ABNORMAL HIGH (ref 70–99)
Potassium: 4.1 mmol/L (ref 3.5–5.1)
Sodium: 138 mmol/L (ref 135–145)
Total Bilirubin: 0.7 mg/dL (ref 0.3–1.2)
Total Protein: 6.4 g/dL — ABNORMAL LOW (ref 6.5–8.1)

## 2018-08-15 LAB — BRAIN NATRIURETIC PEPTIDE: B Natriuretic Peptide: 46.2 pg/mL (ref 0.0–100.0)

## 2018-08-15 LAB — TSH: TSH: 2.941 u[IU]/mL (ref 0.350–4.500)

## 2018-08-15 LAB — LIPASE, BLOOD: Lipase: 26 U/L (ref 11–51)

## 2018-08-15 LAB — LACTIC ACID, PLASMA: Lactic Acid, Venous: 0.7 mmol/L (ref 0.5–1.9)

## 2018-08-15 LAB — TROPONIN I: Troponin I: <0.03 ng/mL (ref <0.03)

## 2018-08-15 LAB — SARS CORONAVIRUS 2 BY RT PCR (HOSPITAL ORDER, PERFORMED IN ~~LOC~~ HOSPITAL LAB): SARS Coronavirus 2: NEGATIVE

## 2018-08-15 MED ORDER — IOHEXOL 300 MG/ML  SOLN
80.0000 mL | Freq: Once | INTRAMUSCULAR | Status: AC | PRN
  Administered 2018-08-15: 80 mL via INTRAVENOUS

## 2018-08-15 NOTE — ED Provider Notes (Signed)
I have personally seen and examined the patient. I have reviewed the documentation on PMH/FH/Soc Hx. I have discussed the plan of care with the resident and patient.  I have reviewed and agree with the resident's documentation. Please see associated encounter note.  Briefly, the patient is a 78 y.o. male here with generalized weakness.  Patient with multiple complaints.  Normal vitals.  No fever.  Patient states that he has not felt well over the last several weeks.  His daughter finally convinced him to be evaluated today after she went to visit him.  Patient has had nonproductive cough, weight loss, anorexia, hearing loss, fatigue.  Overall is exam is normal.  He appears pleasant.  He does not have any really specific symptoms to specifically say he is at risk for ACS or PE.  He likely has ongoing chronic process.  He has been on multiple courses of antibiotics and steroids for bronchitis.  Overall we will do a broad work-up to include lab work including CT scans of head, chest, abdomen and pelvis.  Patient has already tested negative for coronavirus recently.  EKG is reassuring with no ischemic changes.  Lab work with no significant leukocytosis, anemia, electrolyte abnormality.  COVID test negative.  Urinalysis negative.  CT scan of head, chest, abdomen, pelvis overall unremarkable.  Lactic acid normal.  Lipase normal.  TSH normal.  Patient still mildly tachycardic upon reevaluation.  Overall patient with picture of failure to thrive.  Will admit to medicine for further hydration and care.  This chart was dictated using voice recognition software.  Despite best efforts to proofread,  errors can occur which can change the documentation meaning.     EKG Interpretation  Date/Time:  Wednesday Aug 15 2018 17:49:50 EDT Ventricular Rate:  108 PR Interval:    QRS Duration: 93 QT Interval:  329 QTC Calculation: 441 R Axis:   -5 Text Interpretation:  Sinus tachycardia Ventricular premature complex  Borderline T abnormalities, lateral leads Confirmed by Lennice Sites [phone]) on 08/15/2018 6:24:51 PM         Lennice Sites, DO 08/16/18 0019

## 2018-08-15 NOTE — ED Notes (Signed)
Travis Page, Daughter [phone]

## 2018-08-15 NOTE — ED Triage Notes (Addendum)
Patient sent by doctor for further evaluation of multiple complaints - patient states, "it started out as a sinus infection months ago, then I was given 4 courses of antibiotics, steroid for bronchitis, and I still feel bad." He endorses productive cough, fevers/chills off and on, nausea, anorexia, weight loss, hearing loss, fatigue, progressive shortness of breath, and L-sided CP intermittent, all over the last 4+ months. Denies any new symptoms. Seen for same at [facility] in [place]; states he was tested for COVID and it was negative. Currently denies pain. Resp e/u, skin w/d.

## 2018-08-15 NOTE — ED Provider Notes (Signed)
[name]   CSN: [id number] Arrival date & time: 08/15/18  1735    History   Chief Complaint Chief Complaint  Patient presents with   Sent By Doctor    HPI Travis MARQUEST Page is a 78 y.o. male.   HPI  78 year old male with a history of hypertension and high cholesterol presents with multiple complaints.  Patient states that he has had worsening fatigue, nausea, shortness of breath, and chest pain over the past few months.  Chest pain is left-sided described as sharp, intermittent, is nonradiating, and worse with coughing.  He also complains of periumbilical abdominal pain and dysuria.  No fevers.  He has had chills.  He also complains of headache and neck pain.  He was tested for COVID approximately a month ago and negative.  I called patient's daughter, Travis Page, who states that her dad has had a general decline over the past few months.  He was evaluated at the ED in [facility] in January for fatigue and diagnosed with indigestion.  He was seen in the ED again on April 19 for chest pain and again on April 28 and diagnosed with bronchitis and costochondritis.  He has been unable to follow-up with his PCP.  He has had a approximately 20 pound weight loss in the past month.   Past Medical History:  Diagnosis Date   Fatigue    High cholesterol    Hypertension    Shortness of breath     Patient Active Problem List   Diagnosis Date Noted   History of colonic polyps 11/27/2017   Esophageal dysphagia 11/27/2017   Abnormal stress test    Dysphagia 11/04/2015   Essential hypertension 10/28/2015   High cholesterol 10/28/2015    Past Surgical History:  Procedure Laterality Date   CHOLECYSTECTOMY     COLONOSCOPY N/A 01/22/2018   Procedure: COLONOSCOPY;  Surgeon: Rogene Houston, MD;  [facility];  Service: Endoscopy;  Laterality: N/A;   ESOPHAGEAL DILATION N/A 12/10/2015   Procedure: ESOPHAGEAL DILATION;   Surgeon: Rogene Houston, MD;  Location: [facility];  Service: Endoscopy;  Laterality: N/A;   ESOPHAGEAL DILATION N/A 01/22/2018   Procedure: ESOPHAGEAL DILATION;  Surgeon: Rogene Houston, MD;  [facility]: [facility];  Service: Endoscopy;  Laterality: N/A;   ESOPHAGOGASTRODUODENOSCOPY N/A 12/10/2015   Procedure: ESOPHAGOGASTRODUODENOSCOPY (EGD);  Surgeon: Rogene Houston, MD;  [facility];  Service: Endoscopy;  Laterality: N/A;   ESOPHAGOGASTRODUODENOSCOPY N/A 01/22/2018   Procedure: ESOPHAGOGASTRODUODENOSCOPY (EGD);  Surgeon: Rogene Houston, MD;  [facility];  Service: Endoscopy;  Laterality: N/A;  12:00-rescheduled 11/4 @ 8:15am per Lelon Frohlich   LEFT HEART CATH AND CORONARY ANGIOGRAPHY N/A 12/07/2016   Procedure: LEFT HEART CATH AND CORONARY ANGIOGRAPHY;  Surgeon: Jettie Booze, MD;  Location: [facility];  Service: Cardiovascular;  Laterality: N/A;   POLYPECTOMY  01/22/2018   Procedure: POLYPECTOMY;  Surgeon: Rogene Houston, MD;  Location: [facility];  Service: Endoscopy;;  colon   REPLACEMENT TOTAL KNEE     rt knee         Home Medications    Prior to Admission medications   Medication Sig Start Date End Date Taking? Authorizing Provider  aspirin EC 81 MG tablet Take 1 tablet (81 mg total) by mouth at bedtime. 01/23/18   Rehman, Mechele Dawley, MD  cyanocobalamin (,VITAMIN B-12,) 1000 MCG/ML injection Inject 1,000 mcg into the muscle every 30 (thirty)  days.    [provider]  dutasteride (AVODART) 0.5 MG capsule Take 0.5 mg by mouth daily at 3 pm.     [provider]  fenofibrate 160 MG tablet Take 160 mg by mouth daily.    [provider]  HYDROcodone-acetaminophen (NORCO) 10-325 MG tablet Take 1 tablet by mouth every 6 (six) hours as needed (for pain).  06/17/15   [provider]  losartan (COZAAR) 50 MG tablet Take 50 mg by mouth daily at 3 pm.     [provider]  pantoprazole (PROTONIX) 40 MG  tablet Take 1 tablet (40 mg total) by mouth daily before breakfast. 01/22/18   Rehman, Mechele Dawley, MD  zolpidem (AMBIEN) 10 MG tablet Take 10 mg by mouth at bedtime.    [provider]    Family History Family History  Problem Relation Age of Onset   Heart attack Mother    Hypercholesterolemia Mother    Heart attack Father    Hypercholesterolemia Sister    CAD Sister     Social History Social History   Tobacco Use   Smoking status: Never Smoker   Smokeless tobacco: Current User    Types: Chew  Substance Use Topics   Alcohol use: No   Drug use: No     Allergies   Statins   Review of Systems Review of Systems  Constitutional: Positive for fatigue. Negative for chills and fever.  HENT: Negative for ear pain and sore throat.   Eyes: Negative for pain and visual disturbance.  Respiratory: Positive for cough and shortness of breath.   Cardiovascular: Negative for chest pain and palpitations.  Gastrointestinal: Positive for abdominal pain and nausea. Negative for vomiting.  Genitourinary: Negative for dysuria and hematuria.  Musculoskeletal: Negative for arthralgias and back pain.  Skin: Negative for color change and rash.  Neurological: Positive for headaches. Negative for seizures and syncope.  All other systems reviewed and are negative.    Physical Exam Updated Vital Signs BP 123/80    Pulse (!) 109    Temp 98.3 F (36.8 C) (Oral)    Resp (!) 22    Ht 6' (1.829 m)    Wt 79.4 kg    SpO2 97%    BMI 23.73 kg/m   Physical Exam Vitals signs and nursing note reviewed.  Constitutional:      Appearance: He is well-developed. He is ill-appearing.  HENT:     Head: Normocephalic and atraumatic.     Right Ear: Tympanic membrane normal.     Left Ear: Tympanic membrane normal.     Mouth/Throat:     Mouth: Mucous membranes are dry.  Eyes:     Conjunctiva/sclera: Conjunctivae normal.  Neck:     Musculoskeletal: Neck supple. No neck rigidity.    Cardiovascular:     Rate and Rhythm: Normal rate and regular rhythm.     Heart sounds: No murmur.  Pulmonary:     Effort: Pulmonary effort is normal. No respiratory distress.     Breath sounds: Normal breath sounds.  Abdominal:     Palpations: Abdomen is soft.     Tenderness: There is abdominal tenderness in the periumbilical area.  Musculoskeletal: Normal range of motion.        General: No tenderness.     Right lower leg: No edema.     Left lower leg: No edema.  Lymphadenopathy:     Cervical: No cervical adenopathy.  Skin:    General: Skin is warm and  dry.  Neurological:     Mental Status: He is alert.     Comments:  Mental Status:  Orientation: Alert and oriented to person, place, and time.  Memory: Cooperative, follows commands well. Recent and remote memory normal.  Attention, Concentration: Attention span and concentration are normal.  Language: Speech is clear and language is normal.  Fund of Knowledge: Aware of current events, vocabulary appropriate for patient age.  Cranial Nerves   II Visual Fields:  Intact to confrontation III, IV, VI:  Pupils equal and reactive to light and near. Full eye movement without nystagmus  V Facial Sensation:  Normal. No weakness of masticatory muscles  VII:  No facial weakness or asymmetry  VIII Auditory Acuity:  Grossly normal  IX/X:  The uvula is midline; the palate elevates symmetrically  XI:  Normal sternocleidomastoid and trapezius strength  XII:  The tongue is midline. No atrophy or fasciculations.    Motor System:  Muscle Strength: 5/5 and symmetric in the upper and lower extremities. No pronation or drift.  Muscle Tone: Tone and muscle bulk are normal in the upper and lower extremities.   Coordination:  Intact finger-to-nose, heel-to-shin, and rapid alternating movements. No tremor.  Sensation:  Intact to light touch Gait:  Deferred         ED Treatments / Results  Labs (all labs ordered are listed, but only abnormal  results are displayed) Labs Reviewed  CBC WITH DIFFERENTIAL/PLATELET - Abnormal; Notable for the following components:      Result Value   WBC 12.6 (*)    Hemoglobin 10.5 (*)    HCT 33.8 (*)    MCH 24.9 (*)    Platelets 641 (*)    Neutro Abs 9.5 (*)    Monocytes Absolute 1.1 (*)    Abs Immature Granulocytes 0.25 (*)    All other components within normal limits  COMPREHENSIVE METABOLIC PANEL - Abnormal; Notable for the following components:   Glucose, Bld 105 (*)    Creatinine, Ser 1.56 (*)    Total Protein 6.4 (*)    Albumin 3.0 (*)    AST 10 (*)    GFR calc non Af Amer 42 (*)    GFR calc Af Amer 48 (*)    All other components within normal limits  URINALYSIS, ROUTINE W REFLEX MICROSCOPIC - Abnormal; Notable for the following components:   APPearance HAZY (*)    Hgb urine dipstick MODERATE (*)    Protein, ur 30 (*)    All other components within normal limits  SARS CORONAVIRUS 2 (HOSPITAL ORDER, [facility])  TROPONIN I  BRAIN NATRIURETIC PEPTIDE  LIPASE, BLOOD  TSH  LACTIC ACID, PLASMA  CK    EKG EKG Interpretation  Date/Time:  Wednesday Aug 15 2018 17:49:50 EDT Ventricular Rate:  108 PR Interval:    QRS Duration: 93 QT Interval:  329 QTC Calculation: 441 R Axis:   -5 Text Interpretation:  Sinus tachycardia Ventricular premature complex Borderline T abnormalities, lateral leads Confirmed by Lennice Sites [phone]) on 08/15/2018 6:24:51 PM   Radiology Ct Head Wo Contrast  Result Date: 08/15/2018 CLINICAL DATA:  Shortness of breath and productive cough EXAM: CT HEAD WITHOUT CONTRAST TECHNIQUE: Contiguous axial images were obtained from the base of the skull through the vertex without intravenous contrast. COMPARISON:  None. FINDINGS: Brain: Mild atrophic changes are noted. No findings to suggest acute hemorrhage, acute infarction or space-occupying mass lesion are seen. Vascular: No hyperdense vessel or unexpected calcification.  Skull:  Normal. Negative for fracture or focal lesion. Sinuses/Orbits: No acute finding. Other: None. IMPRESSION: Atrophic changes without acute abnormality. Electronically Signed   By: Inez Catalina M.D.   On: 08/15/2018 23:12   Ct Angio Chest Pe W And/or Wo Contrast  Result Date: 08/15/2018 CLINICAL DATA:  Productive cough and shortness of breath EXAM: CT ANGIOGRAPHY CHEST CT ABDOMEN AND PELVIS WITH CONTRAST TECHNIQUE: Multidetector CT imaging of the chest was performed using the standard protocol during bolus administration of intravenous contrast. Multiplanar CT image reconstructions and MIPs were obtained to evaluate the vascular anatomy. Multidetector CT imaging of the abdomen and pelvis was performed using the standard protocol during bolus administration of intravenous contrast. CONTRAST:  23mL OMNIPAQUE IOHEXOL 300 MG/ML  SOLN COMPARISON:  None. FINDINGS: CTA CHEST FINDINGS Cardiovascular: Thoracic aorta demonstrates mild atherosclerotic calcifications without aneurysmal dilatation or dissection. No cardiac enlargement is seen. Scattered coronary calcifications are noted. The pulmonary artery shows no focal infiltrate or sizable effusion. Mediastinum/Nodes: Thoracic inlet is within normal limits. No hilar or mediastinal adenopathy is seen. The esophagus as visualized is within normal limits. Lungs/Pleura: Mild left lower lobe atelectatic changes are noted. The lungs are otherwise within normal limits. No sizable effusion or pneumothorax is seen. Musculoskeletal: Degenerative changes of the thoracic spine are noted. Review of the MIP images confirms the above findings. CT ABDOMEN and PELVIS FINDINGS Hepatobiliary: Fatty infiltration of the liver is noted. The gallbladder has been surgically removed. Pancreas: Unremarkable. No pancreatic ductal dilatation or surrounding inflammatory changes. Spleen: Normal in size without focal abnormality. Adrenals/Urinary Tract: Adrenal glands are within normal limits. Renal  cystic changes are noted bilaterally slightly worse on the left than the right. The largest of these cysts measures 5 cm in greatest dimension. No definitive renal calculi or obstructive changes are seen. The ureters are within normal limits. The bladder is partially distended. Stomach/Bowel: Scattered diverticular change of the colon is noted. No obstructive or inflammatory changes are seen. The appendix is not well appreciated. No inflammatory changes are seen. Small bowel is within normal limits. Stomach is unremarkable. Vascular/Lymphatic: Aortic atherosclerosis. No enlarged abdominal or pelvic lymph nodes. Reproductive: Prostate is unremarkable. Other: No abdominal wall hernia or abnormality. No abdominopelvic ascites. Musculoskeletal: Degenerative changes of lumbar spine are noted. No acute bony abnormality is seen. Review of the MIP images confirms the above findings. IMPRESSION: CTA of the chest: No evidence of pulmonary emboli. Mild left lower lobe atelectatic changes. CT of the abdomen and pelvis: Chronic changes as described above. No acute abnormality noted. Aortic Atherosclerosis (ICD10-I70.0). Electronically Signed   By: Inez Catalina M.D.   On: 08/15/2018 23:21   Ct Abdomen Pelvis W Contrast  Result Date: 08/15/2018 CLINICAL DATA:  Productive cough and shortness of breath EXAM: CT ANGIOGRAPHY CHEST CT ABDOMEN AND PELVIS WITH CONTRAST TECHNIQUE: Multidetector CT imaging of the chest was performed using the standard protocol during bolus administration of intravenous contrast. Multiplanar CT image reconstructions and MIPs were obtained to evaluate the vascular anatomy. Multidetector CT imaging of the abdomen and pelvis was performed using the standard protocol during bolus administration of intravenous contrast. CONTRAST:  70mL OMNIPAQUE IOHEXOL 300 MG/ML  SOLN COMPARISON:  None. FINDINGS: CTA CHEST FINDINGS Cardiovascular: Thoracic aorta demonstrates mild atherosclerotic calcifications without  aneurysmal dilatation or dissection. No cardiac enlargement is seen. Scattered coronary calcifications are noted. The pulmonary artery shows no focal infiltrate or sizable effusion. Mediastinum/Nodes: Thoracic inlet is within normal limits. No hilar or mediastinal adenopathy is seen. The esophagus as  visualized is within normal limits. Lungs/Pleura: Mild left lower lobe atelectatic changes are noted. The lungs are otherwise within normal limits. No sizable effusion or pneumothorax is seen. Musculoskeletal: Degenerative changes of the thoracic spine are noted. Review of the MIP images confirms the above findings. CT ABDOMEN and PELVIS FINDINGS Hepatobiliary: Fatty infiltration of the liver is noted. The gallbladder has been surgically removed. Pancreas: Unremarkable. No pancreatic ductal dilatation or surrounding inflammatory changes. Spleen: Normal in size without focal abnormality. Adrenals/Urinary Tract: Adrenal glands are within normal limits. Renal cystic changes are noted bilaterally slightly worse on the left than the right. The largest of these cysts measures 5 cm in greatest dimension. No definitive renal calculi or obstructive changes are seen. The ureters are within normal limits. The bladder is partially distended. Stomach/Bowel: Scattered diverticular change of the colon is noted. No obstructive or inflammatory changes are seen. The appendix is not well appreciated. No inflammatory changes are seen. Small bowel is within normal limits. Stomach is unremarkable. Vascular/Lymphatic: Aortic atherosclerosis. No enlarged abdominal or pelvic lymph nodes. Reproductive: Prostate is unremarkable. Other: No abdominal wall hernia or abnormality. No abdominopelvic ascites. Musculoskeletal: Degenerative changes of lumbar spine are noted. No acute bony abnormality is seen. Review of the MIP images confirms the above findings. IMPRESSION: CTA of the chest: No evidence of pulmonary emboli. Mild left lower lobe  atelectatic changes. CT of the abdomen and pelvis: Chronic changes as described above. No acute abnormality noted. Aortic Atherosclerosis (ICD10-I70.0). Electronically Signed   By: Inez Catalina M.D.   On: 08/15/2018 23:21    Procedures Procedures (including critical care time)  Medications Ordered in ED Medications  iohexol (OMNIPAQUE) 300 MG/ML solution 80 mL (80 mLs Intravenous Contrast Given 08/15/18 2236)  lactated ringers bolus 1,000 mL (1,000 mLs Intravenous New Bag/Given 08/16/18 0015)     Initial Impression / Assessment and Plan / ED Course  I have reviewed the triage vital signs and the nursing notes.  Pertinent labs & imaging results that were available during my care of the patient were reviewed by me and considered in my medical decision making (see chart for details).  78 year old male with a history of hypertension and high cholesterol presents with multiple complaints.  Patient is tachycardic but normotensive. Patient states that he has had worsening fatigue, nausea, shortness of breath, and chest pain over the past few months.  He also endorses abdominal pain and dysuria.   History is concerning for underlying malignancy.  Will perform broad work-up including labs and CT imaging of the head, chest, abdomen, and pelvis.   CBC notable for WBC 12.6, hemoglobin 10.5, platelets 641.  Glucose 105, creatinine 1.56.  Troponin undetectable.  BNP within normal limits.  Lipase 26.  TSH within normal limits.  Lactic acid 0.7.  UA with 30 protein and moderate hemoglobin but otherwise unremarkable.  EKG sinus tachycardia.  Rate 108.  CT head shows no acute abnormality.  CTA of the chest shows no evidence of pulmonary emboli.  There is mild left lower lobe atelectatic changes.  CT of the abdomen and pelvis shows no acute abnormalities.  Patient has remained tachycardic into the 120s.  No clear infectious etiology patient symptoms.  Patient severely dyspneic with exertion.  Patient  admitted for further management.   Final Clinical Impressions(s) / ED Diagnoses   Final diagnoses:  None    ED Discharge Orders    None       Trinidad Curet, MD 08/16/18 3710    Lennice Sites, DO  08/16/18 0040 ° °

## 2018-08-16 ENCOUNTER — Observation Stay (HOSPITAL_BASED_OUTPATIENT_CLINIC_OR_DEPARTMENT_OTHER): Payer: Medicare Other

## 2018-08-16 DIAGNOSIS — K5909 Other constipation: Secondary | ICD-10-CM

## 2018-08-16 DIAGNOSIS — Z7982 Long term (current) use of aspirin: Secondary | ICD-10-CM

## 2018-08-16 DIAGNOSIS — Z8719 Personal history of other diseases of the digestive system: Secondary | ICD-10-CM | POA: Diagnosis not present

## 2018-08-16 DIAGNOSIS — Z87891 Personal history of nicotine dependence: Secondary | ICD-10-CM | POA: Diagnosis not present

## 2018-08-16 DIAGNOSIS — B3781 Candidal esophagitis: Principal | ICD-10-CM

## 2018-08-16 DIAGNOSIS — R630 Anorexia: Secondary | ICD-10-CM | POA: Diagnosis not present

## 2018-08-16 DIAGNOSIS — R634 Abnormal weight loss: Secondary | ICD-10-CM | POA: Diagnosis present

## 2018-08-16 DIAGNOSIS — N179 Acute kidney failure, unspecified: Secondary | ICD-10-CM | POA: Diagnosis present

## 2018-08-16 DIAGNOSIS — Z9049 Acquired absence of other specified parts of digestive tract: Secondary | ICD-10-CM | POA: Diagnosis not present

## 2018-08-16 DIAGNOSIS — K219 Gastro-esophageal reflux disease without esophagitis: Secondary | ICD-10-CM

## 2018-08-16 DIAGNOSIS — Z79899 Other long term (current) drug therapy: Secondary | ICD-10-CM | POA: Diagnosis not present

## 2018-08-16 DIAGNOSIS — N401 Enlarged prostate with lower urinary tract symptoms: Secondary | ICD-10-CM

## 2018-08-16 DIAGNOSIS — I10 Essential (primary) hypertension: Secondary | ICD-10-CM | POA: Diagnosis present

## 2018-08-16 DIAGNOSIS — R0600 Dyspnea, unspecified: Secondary | ICD-10-CM

## 2018-08-16 DIAGNOSIS — R627 Adult failure to thrive: Secondary | ICD-10-CM | POA: Diagnosis present

## 2018-08-16 DIAGNOSIS — I2510 Atherosclerotic heart disease of native coronary artery without angina pectoris: Secondary | ICD-10-CM

## 2018-08-16 DIAGNOSIS — Z888 Allergy status to other drugs, medicaments and biological substances status: Secondary | ICD-10-CM | POA: Diagnosis not present

## 2018-08-16 DIAGNOSIS — H9190 Unspecified hearing loss, unspecified ear: Secondary | ICD-10-CM | POA: Diagnosis present

## 2018-08-16 DIAGNOSIS — R110 Nausea: Secondary | ICD-10-CM

## 2018-08-16 DIAGNOSIS — K2970 Gastritis, unspecified, without bleeding: Secondary | ICD-10-CM | POA: Diagnosis not present

## 2018-08-16 DIAGNOSIS — R509 Fever, unspecified: Secondary | ICD-10-CM | POA: Diagnosis not present

## 2018-08-16 DIAGNOSIS — Z96651 Presence of right artificial knee joint: Secondary | ICD-10-CM | POA: Diagnosis present

## 2018-08-16 DIAGNOSIS — Z972 Presence of dental prosthetic device (complete) (partial): Secondary | ICD-10-CM

## 2018-08-16 DIAGNOSIS — R319 Hematuria, unspecified: Secondary | ICD-10-CM

## 2018-08-16 DIAGNOSIS — D509 Iron deficiency anemia, unspecified: Secondary | ICD-10-CM | POA: Diagnosis present

## 2018-08-16 DIAGNOSIS — E7800 Pure hypercholesterolemia, unspecified: Secondary | ICD-10-CM | POA: Diagnosis present

## 2018-08-16 DIAGNOSIS — R3912 Poor urinary stream: Secondary | ICD-10-CM

## 2018-08-16 DIAGNOSIS — E785 Hyperlipidemia, unspecified: Secondary | ICD-10-CM

## 2018-08-16 DIAGNOSIS — B370 Candidal stomatitis: Secondary | ICD-10-CM | POA: Diagnosis present

## 2018-08-16 DIAGNOSIS — E860 Dehydration: Secondary | ICD-10-CM | POA: Diagnosis present

## 2018-08-16 DIAGNOSIS — Z8349 Family history of other endocrine, nutritional and metabolic diseases: Secondary | ICD-10-CM | POA: Diagnosis not present

## 2018-08-16 DIAGNOSIS — Z20828 Contact with and (suspected) exposure to other viral communicable diseases: Secondary | ICD-10-CM | POA: Diagnosis present

## 2018-08-16 DIAGNOSIS — I472 Ventricular tachycardia: Secondary | ICD-10-CM | POA: Diagnosis not present

## 2018-08-16 DIAGNOSIS — M549 Dorsalgia, unspecified: Secondary | ICD-10-CM

## 2018-08-16 DIAGNOSIS — K222 Esophageal obstruction: Secondary | ICD-10-CM

## 2018-08-16 DIAGNOSIS — Z8249 Family history of ischemic heart disease and other diseases of the circulatory system: Secondary | ICD-10-CM | POA: Diagnosis not present

## 2018-08-16 DIAGNOSIS — R3911 Hesitancy of micturition: Secondary | ICD-10-CM

## 2018-08-16 LAB — CBC
HCT: 34.3 % — ABNORMAL LOW (ref 39.0–52.0)
Hemoglobin: 10.8 g/dL — ABNORMAL LOW (ref 13.0–17.0)
MCH: 25.1 pg — ABNORMAL LOW (ref 26.0–34.0)
MCHC: 31.5 g/dL (ref 30.0–36.0)
MCV: 79.6 fL — ABNORMAL LOW (ref 80.0–100.0)
Platelets: 566 10*3/uL — ABNORMAL HIGH (ref 150–400)
RBC: 4.31 MIL/uL (ref 4.22–5.81)
RDW: 14.2 % (ref 11.5–15.5)
WBC: 12.5 10*3/uL — ABNORMAL HIGH (ref 4.0–10.5)
nRBC: 0 % (ref 0.0–0.2)

## 2018-08-16 LAB — ECHOCARDIOGRAM COMPLETE
Height: 72 in
Weight: 2777.6 oz

## 2018-08-16 LAB — BASIC METABOLIC PANEL
Anion gap: 10 (ref 5–15)
BUN: 11 mg/dL (ref 8–23)
CO2: 23 mmol/L (ref 22–32)
Calcium: 9.6 mg/dL (ref 8.9–10.3)
Chloride: 103 mmol/L (ref 98–111)
Creatinine, Ser: 1.29 mg/dL — ABNORMAL HIGH (ref 0.61–1.24)
GFR calc Af Amer: >60 mL/min (ref >60)
GFR calc non Af Amer: 52 mL/min — ABNORMAL LOW (ref >60)
Glucose, Bld: 128 mg/dL — ABNORMAL HIGH (ref 70–99)
Potassium: 3.6 mmol/L (ref 3.5–5.1)
Sodium: 136 mmol/L (ref 135–145)

## 2018-08-16 LAB — LIPID PANEL
Cholesterol: 118 mg/dL (ref 0–200)
HDL: 23 mg/dL — ABNORMAL LOW (ref >40)
LDL Cholesterol: 60 mg/dL (ref 0–99)
Total CHOL/HDL Ratio: 5.1 RATIO
Triglycerides: 174 mg/dL — ABNORMAL HIGH (ref <150)
VLDL: 35 mg/dL (ref 0–40)

## 2018-08-16 LAB — HEMOGLOBIN A1C
Hgb A1c MFr Bld: 5.7 % — ABNORMAL HIGH (ref 4.8–5.6)
Mean Plasma Glucose: 116.89 mg/dL

## 2018-08-16 LAB — URINALYSIS, ROUTINE W REFLEX MICROSCOPIC
Bacteria, UA: NONE SEEN
Bilirubin Urine: NEGATIVE
Glucose, UA: NEGATIVE mg/dL
Ketones, ur: NEGATIVE mg/dL
Leukocytes,Ua: NEGATIVE
Nitrite: NEGATIVE
Protein, ur: NEGATIVE mg/dL
Specific Gravity, Urine: 1.011 (ref 1.005–1.030)
pH: 8 (ref 5.0–8.0)

## 2018-08-16 LAB — IRON AND TIBC
Iron: 10 ug/dL — ABNORMAL LOW (ref 45–182)
Saturation Ratios: 4 % — ABNORMAL LOW (ref 17.9–39.5)
TIBC: 253 ug/dL (ref 250–450)
UIBC: 243 ug/dL

## 2018-08-16 LAB — PSA: Prostatic Specific Antigen: 4.22 ng/mL — ABNORMAL HIGH (ref 0.00–4.00)

## 2018-08-16 LAB — FERRITIN: Ferritin: 353 ng/mL — ABNORMAL HIGH (ref 24–336)

## 2018-08-16 LAB — CK: Total CK: 12 U/L — ABNORMAL LOW (ref 49–397)

## 2018-08-16 LAB — HIV ANTIBODY (ROUTINE TESTING W REFLEX): HIV Screen 4th Generation wRfx: NONREACTIVE

## 2018-08-16 LAB — VITAMIN B12: Vitamin B-12: 338 pg/mL (ref 180–914)

## 2018-08-16 MED ORDER — HYDROCODONE-ACETAMINOPHEN 10-325 MG PO TABS: 1.0000 | ORAL_TABLET | Freq: Four times a day (QID) | ORAL | Status: DC | PRN

## 2018-08-16 MED ORDER — ENSURE ENLIVE PO LIQD
237.0000 mL | Freq: Two times a day (BID) | ORAL | Status: DC
  Administered 2018-08-16 (×2): 237 mL via ORAL

## 2018-08-16 MED ORDER — ZOLPIDEM TARTRATE 5 MG PO TABS
5.0000 mg | ORAL_TABLET | Freq: Every day | ORAL | Status: DC
  Administered 2018-08-16 – 2018-08-19 (×4): 5 mg via ORAL
  Filled 2018-08-16 (×5): qty 1

## 2018-08-16 MED ORDER — POLYETHYLENE GLYCOL 3350 17 G PO PACK
17.0000 g | PACK | Freq: Every day | ORAL | Status: DC
  Administered 2018-08-16: 17 g via ORAL
  Filled 2018-08-16: qty 1

## 2018-08-16 MED ORDER — ENSURE ENLIVE PO LIQD
237.0000 mL | Freq: Three times a day (TID) | ORAL | Status: DC
  Administered 2018-08-17 – 2018-08-19 (×5): 237 mL via ORAL

## 2018-08-16 MED ORDER — LACTATED RINGERS IV SOLN
INTRAVENOUS | Status: AC
  Administered 2018-08-16 (×2): via INTRAVENOUS

## 2018-08-16 MED ORDER — ASPIRIN EC 81 MG PO TBEC
81.0000 mg | DELAYED_RELEASE_TABLET | Freq: Every day | ORAL | Status: DC
  Administered 2018-08-16 – 2018-08-19 (×4): 81 mg via ORAL
  Filled 2018-08-16 (×4): qty 1

## 2018-08-16 MED ORDER — PANTOPRAZOLE SODIUM 40 MG PO TBEC
40.0000 mg | DELAYED_RELEASE_TABLET | Freq: Every day | ORAL | Status: DC
  Filled 2018-08-16: qty 1

## 2018-08-16 MED ORDER — ADULT MULTIVITAMIN W/MINERALS CH
1.0000 | ORAL_TABLET | Freq: Every day | ORAL | Status: DC
  Administered 2018-08-17 – 2018-08-20 (×4): 1 via ORAL
  Filled 2018-08-16 (×4): qty 1

## 2018-08-16 MED ORDER — ACETAMINOPHEN 650 MG RE SUPP: 650.0000 mg | Freq: Four times a day (QID) | RECTAL | Status: DC | PRN

## 2018-08-16 MED ORDER — FLUCONAZOLE 200 MG PO TABS
200.0000 mg | ORAL_TABLET | Freq: Every day | ORAL | Status: DC
  Administered 2018-08-17 – 2018-08-20 (×4): 200 mg via ORAL
  Filled 2018-08-16 (×4): qty 1

## 2018-08-16 MED ORDER — DUTASTERIDE 0.5 MG PO CAPS: 0.5000 mg | ORAL_CAPSULE | Freq: Every day | ORAL | Status: DC

## 2018-08-16 MED ORDER — ENOXAPARIN SODIUM 40 MG/0.4ML ~~LOC~~ SOLN
40.0000 mg | SUBCUTANEOUS | Status: DC
  Administered 2018-08-16 – 2018-08-19 (×4): 40 mg via SUBCUTANEOUS
  Filled 2018-08-16 (×4): qty 0.4

## 2018-08-16 MED ORDER — DUTASTERIDE 0.5 MG PO CAPS
0.5000 mg | ORAL_CAPSULE | Freq: Every day | ORAL | Status: DC
  Administered 2018-08-16 – 2018-08-19 (×4): 0.5 mg via ORAL
  Filled 2018-08-16 (×5): qty 1

## 2018-08-16 MED ORDER — LACTATED RINGERS IV BOLUS
1000.0000 mL | Freq: Once | INTRAVENOUS | Status: AC
  Administered 2018-08-16: 1000 mL via INTRAVENOUS

## 2018-08-16 MED ORDER — ACETAMINOPHEN 325 MG PO TABS
650.0000 mg | ORAL_TABLET | Freq: Four times a day (QID) | ORAL | Status: DC | PRN
  Administered 2018-08-17 – 2018-08-19 (×4): 650 mg via ORAL
  Filled 2018-08-16 (×4): qty 2

## 2018-08-16 MED ORDER — FENOFIBRATE 160 MG PO TABS
160.0000 mg | ORAL_TABLET | Freq: Every day | ORAL | Status: DC
  Administered 2018-08-16 – 2018-08-20 (×5): 160 mg via ORAL
  Filled 2018-08-16 (×5): qty 1

## 2018-08-16 MED ORDER — SUCRALFATE 1 GM/10ML PO SUSP
1.0000 g | Freq: Two times a day (BID) | ORAL | Status: DC
  Administered 2018-08-16 – 2018-08-20 (×9): 1 g via ORAL
  Filled 2018-08-16 (×9): qty 10

## 2018-08-16 MED ORDER — FLUCONAZOLE 200 MG PO TABS
400.0000 mg | ORAL_TABLET | Freq: Once | ORAL | Status: AC
  Administered 2018-08-16: 400 mg via ORAL
  Filled 2018-08-16 (×2): qty 2

## 2018-08-16 MED ORDER — TAMSULOSIN HCL 0.4 MG PO CAPS: 0.4000 mg | ORAL_CAPSULE | Freq: Every day | ORAL | Status: DC

## 2018-08-16 MED ORDER — SENNOSIDES-DOCUSATE SODIUM 8.6-50 MG PO TABS
1.0000 | ORAL_TABLET | Freq: Two times a day (BID) | ORAL | Status: DC
  Administered 2018-08-16 – 2018-08-18 (×4): 1 via ORAL
  Filled 2018-08-16 (×10): qty 1

## 2018-08-16 MED ORDER — PANTOPRAZOLE SODIUM 40 MG PO TBEC
40.0000 mg | DELAYED_RELEASE_TABLET | Freq: Two times a day (BID) | ORAL | Status: DC
  Administered 2018-08-16 – 2018-08-20 (×8): 40 mg via ORAL
  Filled 2018-08-16 (×8): qty 1

## 2018-08-16 NOTE — Progress Notes (Signed)
Initial Nutrition Assessment  DOCUMENTATION CODES:   Not applicable  INTERVENTION:   -Increase Ensure Enlive po to TID, each supplement provides 350 kcal and 20 grams of protein -MVI with minerals daily -Magic Cup TID with meals, each supplement provides 290 kcals and 9 grams protein -Hormel Shake TID with meals, each supplement provides 520 kclas and 22 grams protein  NUTRITION DIAGNOSIS:   Inadequate oral intake related to altered GI function, decreased appetite as evidenced by meal completion < 25%.  GOAL:   Patient will meet greater than or equal to 90% of their needs  MONITOR:   PO intake, Supplement acceptance, Labs, Weight trends, Skin, I & O's  REASON FOR ASSESSMENT:   Malnutrition Screening Tool    ASSESSMENT:   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 Differential diagnosis includes 1) nausea 2/2 constipation vs GERD which has lead to decreased PO intake, malnutrition, deconditioning and his dyspnea on exertion, 2) valvular or other structure heart abnormality causing his DOE (although this would be unlikely to contribute to his nausea and anorexia, 3) multiple myeloma (3/4 CRAB signs-mild hypercalcemia, renal dysfunction, anemia). Much less likely cause includes prostate cancer (he does have history of weak stream but has been out of his Flomax; prostate appears normal on CT). He is up to date on age-appropriate cancer screening (colon and lung cancer).   Pt admitted with dyspnea.   Reviewed I/O's: -100 ml x 24 hours  UOP: 100 ml x 24 hours  Pt receiving nursing at times of both visits. Unable to obtain further nutrition-related history or complete nutrition-focused physical exam at this time.   Per chart review, pt with decreased oral intake related to GERD and dyspnea over the past 2 months. Per MD notes, pt also  with esophageal candidiasis. Meal completion has been poor; noted PO 10-25%.  Reviewed wt hx; noted pt has experienced a 10.8% wt loss over the past 3 months, which is significant for time frame.   Pt is at high risk for malnutrition given history of ongoing poor oral intake and significant wt loss. However, unable to identify at this time without further history and completion of nutriton-focused physical exam. Pt would greatly benefit from addition of nutritional supplements.   Labs reviewed.   NUTRITION - FOCUSED PHYSICAL EXAM:    Most Recent Value  Orbital Region  Unable to assess  Upper Arm Region  Unable to assess  Thoracic and Lumbar Region  Unable to assess  Buccal Region  Unable to assess  Temple Region  Unable to assess  Clavicle Bone Region  Unable to assess  Clavicle and Acromion Bone Region  Unable to assess  Scapular Bone Region  Unable to assess  Dorsal Hand  Unable to assess  Patellar Region  Unable to assess  Anterior Thigh Region  Unable to assess  Posterior Calf Region  Unable to assess  Edema (RD Assessment)  Unable to assess  Hair  Unable to assess  Eyes  Unable to assess  Mouth  Unable to assess  Skin  Unable to assess  Nails  Unable to assess       Diet Order:   Diet Order            Diet regular Room service appropriate? Yes; Fluid consistency: Thin  Diet effective now  EDUCATION NEEDS:   No education needs have been identified at this time  Skin:  Skin Assessment: Reviewed RN Assessment  Last BM:  08/15/18  Height:   Ht Readings from Last 1 Encounters:  08/16/18 6' (1.829 m)    Weight:   Wt Readings from Last 1 Encounters:  08/16/18 78.7 kg    Ideal Body Weight:  80.9 kg  BMI:  Body mass index is 23.54 kg/m.  Estimated Nutritional Needs:   Kcal:  2150-2350  Protein:  110-125 grams  Fluid:  > 2.1 L    Travis Page A. Jimmye Norman, RD, LDN, [place] Registered Dietitian II Certified Diabetes Care and Education  Specialist Pager: [phone] After hours Pager: [phone]

## 2018-08-16 NOTE — ED Notes (Signed)
ED TO INPATIENT HANDOFF REPORT  ED Nurse Name and Phone #:  [phone]  S Name/Age/Gender Travis Page 78 y.o. male Room/Bed: 016C/016C  Code Status   Code Status: Full Code  Home/SNF/Other Home Patient oriented to: self, place, time and situation Is this baseline? Yes   Triage Complete: Triage complete  Chief Complaint sent by dr  Triage Note Patient sent by doctor for further evaluation of multiple complaints - patient states, "it started out as a sinus infection months ago, then I was given 4 courses of antibiotics, steroid for bronchitis, and I still feel bad." He endorses productive cough, fevers/chills off and on, nausea, anorexia, weight loss, hearing loss, fatigue, progressive shortness of breath, and L-sided CP intermittent, all over the last 4+ months. Denies any new symptoms. Seen for same at [facility] in [place]; states he was tested for COVID and it was negative. Currently denies pain. Resp e/u, skin w/d.    Allergies Allergies  Allergen Reactions  . Statins     Muscle aches.    Level of Care/Admitting Diagnosis ED Disposition    ED Disposition Condition Comment   Admit  Hospital Area: [facility] [[id number]]  Level of Care: Med-Surg [16]  Covid Evaluation: Confirmed COVID Negative  Diagnosis: Dyspnea [[id number]]  Admitting Physician: Sid Falcon [phone]  Attending Physician: Sid Falcon [[id number]  PT Class (Do Not Modify): Observation [104]  PT Acc Code (Do Not Modify): Observation [10022]       B Medical/Surgery History Past Medical History:  Diagnosis Date  . Fatigue   . High cholesterol   . Hypertension   . Shortness of breath    Past Surgical History:  Procedure Laterality Date  . CHOLECYSTECTOMY    . COLONOSCOPY N/A 01/22/2018   Procedure: COLONOSCOPY;  Surgeon: Rogene Houston, MD;  [facility];  Service: Endoscopy;  Laterality: N/A;  . ESOPHAGEAL DILATION N/A 12/10/2015   Procedure: ESOPHAGEAL DILATION;  Surgeon:  Rogene Houston, MD;  [facility];  Service: Endoscopy;  Laterality: N/A;  . ESOPHAGEAL DILATION N/A 01/22/2018   Procedure: ESOPHAGEAL DILATION;  Surgeon: Rogene Houston, MD;  [facility];  Service: Endoscopy;  Laterality: N/A;  . ESOPHAGOGASTRODUODENOSCOPY N/A 12/10/2015   Procedure: ESOPHAGOGASTRODUODENOSCOPY (EGD);  Surgeon: Rogene Houston, MD;  [facility];  Service: Endoscopy;  Laterality: N/A;  . ESOPHAGOGASTRODUODENOSCOPY N/A 01/22/2018   Procedure: ESOPHAGOGASTRODUODENOSCOPY (EGD);  Surgeon: Rogene Houston, MD;  [facility];  Service: Endoscopy;  Laterality: N/A;  12:00-rescheduled 11/4 @ 8:15am per Lelon Frohlich  . LEFT HEART CATH AND CORONARY ANGIOGRAPHY N/A 12/07/2016   Procedure: LEFT HEART CATH AND CORONARY ANGIOGRAPHY;  Surgeon: Jettie Booze, MD;  Location: [facility];  Service: Cardiovascular;  Laterality: N/A;  . POLYPECTOMY  01/22/2018   Procedure: POLYPECTOMY;  Surgeon: Rogene Houston, MD;  [facility];  Service: Endoscopy;;  colon  . REPLACEMENT TOTAL KNEE     rt knee      A IV Location/Drains/Wounds Patient Lines/Drains/Airways Status   Active Line/Drains/Airways    Name:   Placement date:   Placement time:   Site:   Days:   Peripheral IV 08/15/18 Right Antecubital   08/15/18    2007    Antecubital   1          Intake/Output Last 24 hours No intake or output data in the 24 hours ending 08/16/18 0216  Labs/Imaging Results for orders placed  or performed during the hospital encounter of 08/15/18 (from the past 48 hour(s))  CBC with Differential     Status: Abnormal   Collection Time: 08/15/18  7:27 PM  Result Value Ref Range   WBC 12.6 (H) 4.0 - 10.5 K/uL   RBC 4.22 4.22 - 5.81 MIL/uL   Hemoglobin 10.5 (L) 13.0 - 17.0 g/dL   HCT 33.8 (L) 39.0 - 52.0 %   MCV 80.1 80.0 - 100.0 fL   MCH 24.9 (L) 26.0 - 34.0 pg   MCHC 31.1 30.0 - 36.0 g/dL   RDW 14.1 11.5 - 15.5 %   Platelets 641 (H) 150 - 400  K/uL   nRBC 0.0 0.0 - 0.2 %   Neutrophils Relative % 75 %   Neutro Abs 9.5 (H) 1.7 - 7.7 K/uL   Lymphocytes Relative 12 %   Lymphs Abs 1.5 0.7 - 4.0 K/uL   Monocytes Relative 9 %   Monocytes Absolute 1.1 (H) 0.1 - 1.0 K/uL   Eosinophils Relative 1 %   Eosinophils Absolute 0.2 0.0 - 0.5 K/uL   Basophils Relative 1 %   Basophils Absolute 0.1 0.0 - 0.1 K/uL   Immature Granulocytes 2 %   Abs Immature Granulocytes 0.25 (H) 0.00 - 0.07 K/uL    Comment: Performed at [facility], [address] [place], [place] [address]  Comprehensive metabolic panel     Status: Abnormal   Collection Time: 08/15/18  7:27 PM  Result Value Ref Range   Sodium 138 135 - 145 mmol/L   Potassium 4.1 3.5 - 5.1 mmol/L   Chloride 103 98 - 111 mmol/L   CO2 24 22 - 32 mmol/L   Glucose, Bld 105 (H) 70 - 99 mg/dL   BUN 13 8 - 23 mg/dL   Creatinine, Ser 1.56 (H) 0.61 - 1.24 mg/dL   Calcium 9.6 8.9 - 10.3 mg/dL   Total Protein 6.4 (L) 6.5 - 8.1 g/dL   Albumin 3.0 (L) 3.5 - 5.0 g/dL   AST 10 (L) 15 - 41 U/L   ALT 10 0 - 44 U/L   Alkaline Phosphatase 53 38 - 126 U/L   Total Bilirubin 0.7 0.3 - 1.2 mg/dL   GFR calc non Af Amer 42 (L) >60 mL/min   GFR calc Af Amer 48 (L) >60 mL/min   Anion gap 11 5 - 15    Comment: Performed at [facility] [address] [place], [place] [address]  Troponin I - ONCE - STAT     Status: None   Collection Time: 08/15/18  7:27 PM  Result Value Ref Range   Troponin I <0.03 <0.03 ng/mL    Comment: Performed at [facility], [place] [address], [place], [place] [address]  Brain natriuretic peptide     Status: None   Collection Time: 08/15/18  7:27 PM  Result Value Ref Range   B Natriuretic Peptide 46.2 0.0 - 100.0 pg/mL    Comment: Performed at [facility] [address], [place], [place] [address]  Lipase, blood     Status: None   Collection Time: 08/15/18  7:27 PM  Result Value Ref Range   Lipase 26 11 - 51 U/L    Comment: Performed at [facility], [place] [address] [place], [place] [address]  TSH     Status: None   Collection Time: 08/15/18  7:27 PM  Result Value Ref Range   TSH 2.941 0.350 -  4.500 uIU/mL    Comment: Performed by a 3rd Generation assay with a functional sensitivity of <=0.01 uIU/mL. Performed at [facility], [place] [address], [place], [place] [address]   Lactic acid, plasma     Status: None   Collection Time: 08/15/18  7:27 PM  Result Value Ref Range   Lactic Acid, Venous 0.7 0.5 - 1.9 mmol/L    Comment: Performed at [facility] [address] [place], [place] [address]  SARS Coronavirus 2 (CEPHEID - Performed in [facility] hospital lab), Hosp Order     Status: None   Collection Time: 08/15/18  8:02 PM  Result Value Ref Range   SARS Coronavirus 2 NEGATIVE NEGATIVE    Comment: (NOTE) If result is NEGATIVE SARS-CoV-2 target nucleic acids are NOT DETECTED. The SARS-CoV-2 RNA is generally detectable in upper and lower  respiratory specimens during the acute phase of infection. The lowest  concentration of SARS-CoV-2 viral copies this assay can detect is 250  copies / mL. A negative result does not preclude SARS-CoV-2 infection  and should not be used as the sole basis for treatment or other  patient management decisions.  A negative result may occur with  improper specimen collection / handling, submission of specimen other  than nasopharyngeal swab, presence of viral mutation(s) within the  areas targeted by this assay, and inadequate number of viral copies  (<250 copies / mL). A negative result must be combined with clinical  observations, patient history, and epidemiological information. If result is POSITIVE SARS-CoV-2 target nucleic acids are DETECTED. The SARS-CoV-2 RNA is generally detectable in upper and lower  respiratory specimens dur ing the acute phase of infection.  Positive  results are indicative of active infection with SARS-CoV-2.  Clinical  correlation with patient history and  other diagnostic information is  necessary to determine patient infection status.  Positive results do  not rule out bacterial infection or co-infection with other viruses. If result is PRESUMPTIVE POSTIVE SARS-CoV-2 nucleic acids MAY BE PRESENT.   A presumptive positive result was obtained on the submitted specimen  and confirmed on repeat testing.  While 2019 novel coronavirus  (SARS-CoV-2) nucleic acids may be present in the submitted sample  additional confirmatory testing may be necessary for epidemiological  and / or clinical management purposes  to differentiate between  SARS-CoV-2 and other Sarbecovirus currently known to infect humans.  If clinically indicated additional testing with an alternate test  methodology [phone]) is advised. The SARS-CoV-2 RNA is generally  detectable in upper and lower respiratory sp ecimens during the acute  phase of infection. The expected result is Negative. Fact Sheet for Patients:  StrictlyIdeas.no Fact Sheet for Healthcare Providers: BankingDealers.co.za This test is not yet approved or cleared by the [place] FDA and has been authorized for detection and/or diagnosis of SARS-CoV-2 by FDA under an Emergency Use Authorization (EUA).  This EUA will remain in effect (meaning this test can be used) for the duration of the COVID-19 declaration under Section 564(b)(1) of the Act, 21 U.S.C. section 360bbb-3(b)(1), unless the authorization is terminated or revoked sooner. Performed at [facility], [place] [address], [place], [place] [address]   Urinalysis, Routine w reflex microscopic     Status: Abnormal   Collection Time: 08/15/18  8:20 PM  Result Value Ref Range   Color, Urine YELLOW YELLOW   APPearance HAZY (A) CLEAR   Specific Gravity, Urine 1.014 1.005 - 1.030   pH 5.0 5.0 -  8.0   Glucose, UA NEGATIVE NEGATIVE mg/dL   Hgb urine dipstick MODERATE (A) NEGATIVE   Bilirubin Urine  NEGATIVE NEGATIVE   Ketones, ur NEGATIVE NEGATIVE mg/dL   Protein, ur 30 (A) NEGATIVE mg/dL   Nitrite NEGATIVE NEGATIVE   Leukocytes,Ua NEGATIVE NEGATIVE   RBC / HPF 0-5 0 - 5 RBC/hpf   WBC, UA 0-5 0 - 5 WBC/hpf   Bacteria, UA NONE SEEN NONE SEEN   Mucus PRESENT    Hyaline Casts, UA PRESENT     Comment: Performed at [facility] [address], [place], [place] [address]   Ct Head Wo Contrast  Result Date: 08/15/2018 CLINICAL DATA:  Shortness of breath and productive cough EXAM: CT HEAD WITHOUT CONTRAST TECHNIQUE: Contiguous axial images were obtained from the base of the skull through the vertex without intravenous contrast. COMPARISON:  None. FINDINGS: Brain: Mild atrophic changes are noted. No findings to suggest acute hemorrhage, acute infarction or space-occupying mass lesion are seen. Vascular: No hyperdense vessel or unexpected calcification. Skull: Normal. Negative for fracture or focal lesion. Sinuses/Orbits: No acute finding. Other: None. IMPRESSION: Atrophic changes without acute abnormality. Electronically Signed   By: Inez Catalina M.D.   On: 08/15/2018 23:12   Ct Angio Chest Pe W And/or Wo Contrast  Result Date: 08/15/2018 CLINICAL DATA:  Productive cough and shortness of breath EXAM: CT ANGIOGRAPHY CHEST CT ABDOMEN AND PELVIS WITH CONTRAST TECHNIQUE: Multidetector CT imaging of the chest was performed using the standard protocol during bolus administration of intravenous contrast. Multiplanar CT image reconstructions and MIPs were obtained to evaluate the vascular anatomy. Multidetector CT imaging of the abdomen and pelvis was performed using the standard protocol during bolus administration of intravenous contrast. CONTRAST:  33mL OMNIPAQUE IOHEXOL 300 MG/ML  SOLN COMPARISON:  None. FINDINGS: CTA CHEST FINDINGS Cardiovascular: Thoracic aorta demonstrates mild atherosclerotic calcifications without aneurysmal dilatation or dissection. No cardiac enlargement is seen. Scattered  coronary calcifications are noted. The pulmonary artery shows no focal infiltrate or sizable effusion. Mediastinum/Nodes: Thoracic inlet is within normal limits. No hilar or mediastinal adenopathy is seen. The esophagus as visualized is within normal limits. Lungs/Pleura: Mild left lower lobe atelectatic changes are noted. The lungs are otherwise within normal limits. No sizable effusion or pneumothorax is seen. Musculoskeletal: Degenerative changes of the thoracic spine are noted. Review of the MIP images confirms the above findings. CT ABDOMEN and PELVIS FINDINGS Hepatobiliary: Fatty infiltration of the liver is noted. The gallbladder has been surgically removed. Pancreas: Unremarkable. No pancreatic ductal dilatation or surrounding inflammatory changes. Spleen: Normal in size without focal abnormality. Adrenals/Urinary Tract: Adrenal glands are within normal limits. Renal cystic changes are noted bilaterally slightly worse on the left than the right. The largest of these cysts measures 5 cm in greatest dimension. No definitive renal calculi or obstructive changes are seen. The ureters are within normal limits. The bladder is partially distended. Stomach/Bowel: Scattered diverticular change of the colon is noted. No obstructive or inflammatory changes are seen. The appendix is not well appreciated. No inflammatory changes are seen. Small bowel is within normal limits. Stomach is unremarkable. Vascular/Lymphatic: Aortic atherosclerosis. No enlarged abdominal or pelvic lymph nodes. Reproductive: Prostate is unremarkable. Other: No abdominal wall hernia or abnormality. No abdominopelvic ascites. Musculoskeletal: Degenerative changes of lumbar spine are noted. No acute bony abnormality is seen. Review of the MIP images confirms the above findings. IMPRESSION: CTA of the chest: No evidence of pulmonary emboli. Mild left lower lobe atelectatic changes. CT of the abdomen  and pelvis: Chronic changes as described above. No  acute abnormality noted. Aortic Atherosclerosis (ICD10-I70.0). Electronically Signed   By: Inez Catalina M.D.   On: 08/15/2018 23:21   Ct Abdomen Pelvis W Contrast  Result Date: 08/15/2018 CLINICAL DATA:  Productive cough and shortness of breath EXAM: CT ANGIOGRAPHY CHEST CT ABDOMEN AND PELVIS WITH CONTRAST TECHNIQUE: Multidetector CT imaging of the chest was performed using the standard protocol during bolus administration of intravenous contrast. Multiplanar CT image reconstructions and MIPs were obtained to evaluate the vascular anatomy. Multidetector CT imaging of the abdomen and pelvis was performed using the standard protocol during bolus administration of intravenous contrast. CONTRAST:  85mL OMNIPAQUE IOHEXOL 300 MG/ML  SOLN COMPARISON:  None. FINDINGS: CTA CHEST FINDINGS Cardiovascular: Thoracic aorta demonstrates mild atherosclerotic calcifications without aneurysmal dilatation or dissection. No cardiac enlargement is seen. Scattered coronary calcifications are noted. The pulmonary artery shows no focal infiltrate or sizable effusion. Mediastinum/Nodes: Thoracic inlet is within normal limits. No hilar or mediastinal adenopathy is seen. The esophagus as visualized is within normal limits. Lungs/Pleura: Mild left lower lobe atelectatic changes are noted. The lungs are otherwise within normal limits. No sizable effusion or pneumothorax is seen. Musculoskeletal: Degenerative changes of the thoracic spine are noted. Review of the MIP images confirms the above findings. CT ABDOMEN and PELVIS FINDINGS Hepatobiliary: Fatty infiltration of the liver is noted. The gallbladder has been surgically removed. Pancreas: Unremarkable. No pancreatic ductal dilatation or surrounding inflammatory changes. Spleen: Normal in size without focal abnormality. Adrenals/Urinary Tract: Adrenal glands are within normal limits. Renal cystic changes are noted bilaterally slightly worse on the left than the right. The largest of these  cysts measures 5 cm in greatest dimension. No definitive renal calculi or obstructive changes are seen. The ureters are within normal limits. The bladder is partially distended. Stomach/Bowel: Scattered diverticular change of the colon is noted. No obstructive or inflammatory changes are seen. The appendix is not well appreciated. No inflammatory changes are seen. Small bowel is within normal limits. Stomach is unremarkable. Vascular/Lymphatic: Aortic atherosclerosis. No enlarged abdominal or pelvic lymph nodes. Reproductive: Prostate is unremarkable. Other: No abdominal wall hernia or abnormality. No abdominopelvic ascites. Musculoskeletal: Degenerative changes of lumbar spine are noted. No acute bony abnormality is seen. Review of the MIP images confirms the above findings. IMPRESSION: CTA of the chest: No evidence of pulmonary emboli. Mild left lower lobe atelectatic changes. CT of the abdomen and pelvis: Chronic changes as described above. No acute abnormality noted. Aortic Atherosclerosis (ICD10-I70.0). Electronically Signed   By: Inez Catalina M.D.   On: 08/15/2018 23:21    Pending Labs Unresulted Labs (From admission, onward)    Start     Ordered   08/16/18 6767  Basic metabolic panel  Tomorrow morning,   R     08/16/18 0212   08/16/18 0500  CBC  Tomorrow morning,   R     08/16/18 0212   08/16/18 0213  Vitamin B12  Add-on,   R     08/16/18 0212   08/16/18 0213  HIV Antibody (routine testing w rflx)  Add-on,   R     08/16/18 0212   08/16/18 0213  HCV Ab Reflex to Quant PCR  Add-on,   R     08/16/18 0212   08/16/18 0213  Hemoglobin A1c  Add-on,   R     08/16/18 0212   08/16/18 0213  PSA  Add-on,   R     08/16/18 0212   08/16/18  0054  Immunofixation electrophoresis  Once,   R     08/16/18 0054   08/16/18 0054  Kappa/lambda light chains  Once,   R     08/16/18 0054   08/16/18 0048  Iron and TIBC  Once,   R     08/16/18 0050   08/16/18 0048  Ferritin  Once,   R     08/16/18 0050    08/16/18 0048  Lipid panel  Once,   R     08/16/18 0050   08/16/18 0023  CK  Add-on,   R     08/16/18 0022          Vitals/Pain Today's Vitals   08/16/18 0012 08/16/18 0030 08/16/18 0100 08/16/18 0130  BP: 123/80 137/83 137/86 (!) 142/70  Pulse: (!) 109 (!) 110 (!) 117 100  Resp: (!) 22 20 (!) 21 20  Temp:      TempSrc:      SpO2: 97% 98% 96% 97%  Weight:      Height:      PainSc:        Isolation Precautions No active isolations  Medications Medications  aspirin EC tablet 81 mg (has no administration in time range)  HYDROcodone-acetaminophen (NORCO) 10-325 MG per tablet 1 tablet (has no administration in time range)  fenofibrate tablet 160 mg (has no administration in time range)  zolpidem (AMBIEN) tablet 10 mg (has no administration in time range)  pantoprazole (PROTONIX) EC tablet 40 mg (has no administration in time range)  dutasteride (AVODART) capsule 0.5 mg (has no administration in time range)  enoxaparin (LOVENOX) injection 40 mg (has no administration in time range)  lactated ringers infusion (has no administration in time range)  acetaminophen (TYLENOL) tablet 650 mg (has no administration in time range)    Or  acetaminophen (TYLENOL) suppository 650 mg (has no administration in time range)  polyethylene glycol (MIRALAX / GLYCOLAX) packet 17 g (has no administration in time range)  senna-docusate (Senokot-S) tablet 1 tablet (has no administration in time range)  iohexol (OMNIPAQUE) 300 MG/ML solution 80 mL (80 mLs Intravenous Contrast Given 08/15/18 2236)  lactated ringers bolus 1,000 mL (1,000 mLs Intravenous New Bag/Given 08/16/18 0015)    Mobility walks Moderate fall risk   Focused Assessments Cardiac Assessment Handoff:  Cardiac Rhythm: Normal sinus rhythm Lab Results  Component Value Date   TROPONINI <0.03 08/15/2018   No results found for: DDIMER Does the Patient currently have chest pain? No     R Recommendations: See Admitting Provider  Note  Report given to:   Additional Notes:  Pt lives at home with his wife.

## 2018-08-16 NOTE — Progress Notes (Signed)
Subjective:  On rounds this am Travis Page stated he felt ok, about the same as when he came in. He had no appetite but was about to try and eat breakfast. He last saw his PCP last month and follows up regularly. He also has seen dermatology in the past for removal of multiple skin lesions on his arms.  He has chronic constipation but denies any dark stools. His throat has been sore recently but he believes this is related to post nasal drip. He denies mouth pain.   Objective:  Vital signs in last 24 hours: Vitals:   08/16/18 0030 08/16/18 0100 08/16/18 0130 08/16/18 0320  BP: 137/83 137/86 (!) 142/70 131/81  Pulse: (!) 110 (!) 117 100 (!) 130  Resp: 20 (!) 21 20 20   Temp:    99.1 F (37.3 C)  TempSrc:    Oral  SpO2: 98% 96% 97% 95%  Weight:    78.7 kg  Height:    6' (1.829 m)   Constitution: NAD, supine in bed HENT: dentures in place, white plaques on tongue, mucous membranes moist, no cervical or inguinal lymphadenopathy Eyes: eom intact Cardio: RRR, no m/r/g  Respiratory: CTA, no wheezing rales or rhonchi  Abdominal: +BS, soft, NTTP  MSK: no LE edema GU: no external hemorrhoids Neuro: alert and oriented, pleasant  Skin: full body exam done and c/d/i with scattered rough scaly patches over the arms and sun exposed areas.    Assessment/Plan:  Active Problems:   Dyspnea  Travis Page is a 78 y.o male with HTN, HLD, Shatzkis ring s/p esophageal dilation and GERD who presents with 2 months of worsening nausea and subsequent anorexia, and dyspnea on exertion.  Nausea, Anorexia, DOE Esophageal Candidiasis Recently decreased appetite for the last two months with significant weight loss of 20 lbs over the last 3 months. +leukocytosis with neutrophil predominance, signs of inflammation with elevated ferritin, and thrombocytosis. Full body skin exam normal. Dyspnea possibly secondary to iron deficiency although doubtful with only slight decrease in hemoglobin. Possibly  secondary to his anorexia as well. On physical exam he does appear to have thrush of the tongue, and may also have esophageal candidiasis with recent antibiotic use and sore throat. He additionally has mild epigastric abdominal pain and tenderness, unchanged by eating, CT abdomen benign, and he takes his protonix right before breakfast. Unlikely to be gastritis but will add carafate and reassess symptoms with treatment of his candidiasis. He has a history of chewing tobacco. Exam of head and neck without significant findings besides thrush but will consider further imaging if symptoms do not improve.   - fluconazole 400 mg loading, then 200 mg 14 days - ECHO pending  - f/u kappa/lambda light chains, IFE - f/u HIV, HCV - increase home protonix to bid - carafate bid   Hematuria No RBCs on UA but hemoglobin positive, CK low. Mildly elevated PSA, which is likely normal for his age.   -  Add on free PSA - repeat UA  Acute Kidney Injury Creatinine improving with fluid bolus, likely prerenal in the setting of dehydration and decreased po intake.   - am BMP   - cont. 100 cc/hr NS   Iron Deficiency Anemia Iron deficient. Doubt infection but will hold off supplement until resolution of leukocytosis or it is clear there is no infection.   - am CBC   Constipation Chronic. Cont. miralax 17g qd, senokot bid.   BPH: PSA Mildly elevated.  - add on free  PSA - continue home dutasteride   Hypertension  CAD - cont. Asa and fenofibrate - holding home losartan due to AKI   VTE: lovenox  IVF: 100cc/hr  Diet: regular  Code: full   Dispo: Anticipated discharge pending clinical workup.   Marty Heck, DO 08/16/2018, 6:37 AM Pager: [phone]

## 2018-08-16 NOTE — Progress Notes (Signed)
  Echocardiogram 2D Echocardiogram has been performed.  Travis Page 08/16/2018, 11:16 AM

## 2018-08-16 NOTE — Plan of Care (Signed)

## 2018-08-16 NOTE — H&P (Signed)
Date: 08/16/2018               Patient Name:  Travis Page MRN: [id number]  DOB: [birth year] Age / Sex: 78 y.o., male   PCP: Curlene Labrum, MD         Medical Service: Internal Medicine Teaching Service         Attending Physician: Dr. Sid Falcon, MD    First Contact: Dr. Sharon Seller Pager: [phone]  Second Contact: Dr. Frederico Hamman Pager: [phone]       After Hours (After 5p/  First Contact Pager: [phone]  weekends / holidays): Second Contact Pager: [phone]   Chief Complaint: SOB  History of Present Illness:   [name] is a 78 y.o male with HTN, HLD, Shatzki's ring s/p esophageal dilation in 11/2015, GERD, and hx of cholecystectomy who presented to the ED with progressive SOB of 2 months duration. History was obtained via the patient and chart review.    Approximately 2 months ago the patient had a sinus infection (sx included intermittent neck, shoulders, and left-sided rib cage pain). He received antibiotics at [facility] but does not believe that this helped. He also reports increasing dyspnea on exertion over the last year which has become more severe over the last 2 months. He is not able to walk more than 75 feet without having to stop.   He reports intermittent left-sided "rib cage pain" which is worse with coughing but not with exertion. He denies palpitations. No trauma to the area or recent physical activity that could have cause the pain. He does state that he sent to the ED by his PCP 2 years ago to "have his heart checked out". Left heart cath showed mild disease in the mail vessels with significant small vessel disease including distal circumflex. Recommended aggressive risk factor modification.   He endorses intermittent dull epigastric and right upper quadrant abdominal pain which has worsened over the last 2 months. In 01/2018 he was having dysphagia to solid foods. EGD performed demonstrated Schatziki's rings and he underwent an esophageal dilation. He  states that since then he has not had any issues with swallowing. He does not have abdominal pain with eating. He does however have worsening nausea which has kept him from eating. He has lost 20 pounds over the last 3 months.  He endorses issues with constipation since November stating that he may go one time a day which is unusual for him.  He has been trying to drink plenty of water because he is "worried about his kidneys". His doctor reportedly told him that "his kidney numbers are low". Has had intermittent dysuria but denies hematuria. He does have some issues with weak stream and difficulties initiating micturition (chronic issue, has been out of Flomax). He has had productive cough, clear sputum. This cough has been going on for 1 month. He denies hemoptysis. Has had dizziness/lightheadedness with standing and feels unstable on his feet or with ambulation. Has generalized weakness. He denies vision changes. He denies orthopnea, LE, increased abdominal girth. Denies pain anywhere. Denies skin changes. Denies recent travel.   His daughter came to town last night. After seeing him she felt he needed to be evaluated in by a doctor. She called his PCP and his PCP evaluated him. His PCP recommended coming to [facility] ED for further evaluation.   In the ED who was found to be mildly hypertensive with SBP into the 140s, and tachycardic up to 110.  Remaining vital signs were otherwise unremarkable.  UA positive for hematuria and proteinuria.  Hylan casts present.  CBC showed leukocytosis of 12.6, normocytic anemia with a hemoglobin of 10.5 (last hemoglobin 13.8 on 04/16/2018), cytosis with platelets of 641.  CMP with elevated creatinine of 1.56 (appears to have been slowly increasing over the last several months 1.11 in Jan, 1.22 in March, and 1.56 today).  BNP, lipase, TSH, and lactic acid all WNL.  Troponin < 0.03.  There was concern for an underlying malignancy and he underwent a CT of the abdomen, pelvis, chest,  and head.  CT head showed atrophic changes without an acute abnormality.  CTA chest showed mild left lower lobe atelectasis.  CT abdomen/pelvis showed fatty infiltration of the liver, renal cystic changes bilaterally, diverticulosis.  He was given 1 L IV fluids.  Meds:  No current facility-administered medications on file prior to encounter.    Current Outpatient Medications on File Prior to Encounter  Medication Sig  . aspirin EC 81 MG tablet Take 1 tablet (81 mg total) by mouth at bedtime.  . cyanocobalamin (,VITAMIN B-12,) 1000 MCG/ML injection Inject 1,000 mcg into the muscle every 30 (thirty) days.  Marland Kitchen dutasteride (AVODART) 0.5 MG capsule Take 0.5 mg by mouth daily at 3 pm.   . fenofibrate 160 MG tablet Take 160 mg by mouth daily.  Marland Kitchen HYDROcodone-acetaminophen (NORCO) 10-325 MG tablet Take 1 tablet by mouth every 6 (six) hours as needed (for pain).   Marland Kitchen losartan (COZAAR) 50 MG tablet Take 50 mg by mouth daily at 3 pm.   . pantoprazole (PROTONIX) 40 MG tablet Take 1 tablet (40 mg total) by mouth daily before breakfast.  . zolpidem (AMBIEN) 10 MG tablet Take 10 mg by mouth at bedtime.   Allergies: Allergies as of 08/15/2018 - Review Complete 08/15/2018  Allergen Reaction Noted  . Statins  10/28/2015   Past Medical History:  Diagnosis Date  . Fatigue   . High cholesterol   . Hypertension   . Shortness of breath    Family History:  Family History  Problem Relation Age of Onset  . Heart attack Mother   . Hypercholesterolemia Mother   . Heart attack Father   . Hypercholesterolemia Sister   . CAD Sister     Social History:  Retired since age 57 after knee issues. Previously worked as Psychologist, occupational.  Lives with his wife in [place]. Previously used smokeless tobacco for 30 years.  Never user of EtOH.  Denies the use of illicit substances.   Review of Systems: A complete ROS was negative except as per HPI.   Physical Exam: Blood pressure 123/80, pulse (!) 109, temperature  98.3 F (36.8 C), temperature source Oral, resp. rate (!) 22, height 6' (1.829 m), weight 79.4 kg, SpO2 97 %. General: Lying in bed in no acute distress Neuro: Alert and oriented HEENT: Normocephalic, atraumatic, EOMI CV: Tachycardic, normal rhythm, no murmurs. Resp: CTAB, normal WOB, normal oxygen sats on room air Abd: Mild TTP of epigastric and RUQ, soft, no hepatosplenomegaly MSK: No LE edema, erythema, or warmth noted bilaterally Skin: Decreased skin turgor, soft, warm Psych: Normal affect, mood, behavior  EKG: personally reviewed my interpretation is sinus tachycardia with HR of 108. No acute ischemic changes.   CTA Chest/Abd/Pelvis: IMPRESSION: CTA of the chest: No evidence of pulmonary emboli. Mild left lower lobe atelectatic changes. CT of the abdomen and pelvis: Chronic changes as described above. No acute abnormality noted. Aortic Atherosclerosis (ICD10-I70.0).  Head  CT: IMPRESSION: Atrophic changes without acute abnormality.  Assessment & Plan by Problem: Active Problems:   Dyspnea  Mr. Teri Diltz is a 78 y.o male with HTN, HLD, Shatzki's ring s/p esophageal dilation and GERD who presents with 2 months of worsening nausea and subsequent anorexia, and dyspnea on exertion. Differential diagnosis includes 1) nausea 2/2 constipation vs GERD which has lead to decreased PO intake, malnutrition, deconditioning and his dyspnea on exertion, 2) valvular or other structure heart abnormality causing his DOE (although this would be unlikely to contribute to his nausea and anorexia, 3) multiple myeloma (3/4 CRAB signs-mild hypercalcemia, renal dysfunction, anemia). Much less likely cause includes prostate cancer (he does have history of weak stream but has been out of his Flomax; prostate appears normal on CT). He is up to date on age-appropriate cancer screening (colon and lung cancer).   Nausea, anorexia, DOE:  - ECHO - Lipid panel - Kappa/Lambda light chain  - Immunofixation  electrophoresis - PSA - HbA1c - HCV, HIV - Continue home pantoprazole  Acute Kidney Injury: Differential includes pre-renal in the setting of poor PO intake and medical renal disease (2/2 HTN) - IVFs  Normocytic Anemia: Differential includes iron-deficiency anemia, folate and B12 deficiencies (2/2 malnutrition) - Iron and TIBC - Folate, B12  Hematuria: No RBC casts present. Semen, myoglobinuria, hemoglobinuria (less likely, no signs of hemolysis).  - CK  Hypertension: BP within acceptable limits. Home meds include losartan - Hold in the setting of AKI  Back Pain: Has not been on opiates since December. Stable.   CAD: Stable. - Continue home aspirin and fenofibrate  BPH:  - Continue home dutasteride  FEN/GI: Regular DVT PPX: Lovenox Code status: Full   Dispo: Admit patient to Observation with expected length of stay less than 2 midnights.  Signed: Carroll Sage, MD 08/16/2018, 12:15 AM

## 2018-08-17 ENCOUNTER — Inpatient Hospital Stay (HOSPITAL_COMMUNITY): Payer: Medicare Other

## 2018-08-17 DIAGNOSIS — N400 Enlarged prostate without lower urinary tract symptoms: Secondary | ICD-10-CM

## 2018-08-17 DIAGNOSIS — K5900 Constipation, unspecified: Secondary | ICD-10-CM

## 2018-08-17 DIAGNOSIS — R509 Fever, unspecified: Secondary | ICD-10-CM

## 2018-08-17 DIAGNOSIS — K2970 Gastritis, unspecified, without bleeding: Secondary | ICD-10-CM

## 2018-08-17 LAB — CBC
HCT: 31.6 % — ABNORMAL LOW (ref 39.0–52.0)
Hemoglobin: 9.7 g/dL — ABNORMAL LOW (ref 13.0–17.0)
MCH: 24.6 pg — ABNORMAL LOW (ref 26.0–34.0)
MCHC: 30.7 g/dL (ref 30.0–36.0)
MCV: 80.2 fL (ref 80.0–100.0)
Platelets: 603 10*3/uL — ABNORMAL HIGH (ref 150–400)
RBC: 3.94 MIL/uL — ABNORMAL LOW (ref 4.22–5.81)
RDW: 14 % (ref 11.5–15.5)
WBC: 12.5 10*3/uL — ABNORMAL HIGH (ref 4.0–10.5)
nRBC: 0 % (ref 0.0–0.2)

## 2018-08-17 LAB — COMPREHENSIVE METABOLIC PANEL
ALT: 9 U/L (ref 0–44)
AST: 12 U/L — ABNORMAL LOW (ref 15–41)
Albumin: 2.8 g/dL — ABNORMAL LOW (ref 3.5–5.0)
Alkaline Phosphatase: 50 U/L (ref 38–126)
Anion gap: 10 (ref 5–15)
BUN: 9 mg/dL (ref 8–23)
CO2: 25 mmol/L (ref 22–32)
Calcium: 9.6 mg/dL (ref 8.9–10.3)
Chloride: 102 mmol/L (ref 98–111)
Creatinine, Ser: 1.14 mg/dL (ref 0.61–1.24)
GFR calc Af Amer: >60 mL/min (ref >60)
GFR calc non Af Amer: >60 mL/min (ref >60)
Glucose, Bld: 122 mg/dL — ABNORMAL HIGH (ref 70–99)
Potassium: 3.8 mmol/L (ref 3.5–5.1)
Sodium: 137 mmol/L (ref 135–145)
Total Bilirubin: 0.6 mg/dL (ref 0.3–1.2)
Total Protein: 6 g/dL — ABNORMAL LOW (ref 6.5–8.1)

## 2018-08-17 LAB — KAPPA/LAMBDA LIGHT CHAINS
Kappa free light chain: 21.4 mg/L — ABNORMAL HIGH (ref 3.3–19.4)
Kappa, lambda light chain ratio: 1.03 (ref 0.26–1.65)
Lambda free light chains: 20.7 mg/L (ref 5.7–26.3)

## 2018-08-17 LAB — HCV INTERPRETATION

## 2018-08-17 LAB — MAGNESIUM: Magnesium: 1.6 mg/dL — ABNORMAL LOW (ref 1.7–2.4)

## 2018-08-17 LAB — HCV AB W REFLEX TO QUANT PCR: HCV Ab: 0.1 s/co ratio (ref 0.0–0.9)

## 2018-08-17 MED ORDER — AZITHROMYCIN 250 MG PO TABS: 250.0000 mg | ORAL_TABLET | Freq: Every day | ORAL | Status: DC

## 2018-08-17 MED ORDER — IOHEXOL 300 MG/ML  SOLN
80.0000 mL | Freq: Once | INTRAMUSCULAR | Status: AC | PRN
  Administered 2018-08-17: 80 mL via INTRAVENOUS

## 2018-08-17 MED ORDER — PREDNISONE 20 MG PO TABS: 40.0000 mg | ORAL_TABLET | Freq: Every day | ORAL | Status: DC

## 2018-08-17 MED ORDER — AZITHROMYCIN 500 MG PO TABS: 500.0000 mg | ORAL_TABLET | Freq: Every day | ORAL | Status: DC

## 2018-08-17 MED ORDER — MAGNESIUM SULFATE 4 GM/100ML IV SOLN
4.0000 g | Freq: Once | INTRAVENOUS | Status: AC
  Administered 2018-08-17: 4 g via INTRAVENOUS
  Filled 2018-08-17: qty 100

## 2018-08-17 MED ORDER — SODIUM CHLORIDE 0.9 % IV BOLUS
500.0000 mL | Freq: Once | INTRAVENOUS | Status: AC
  Administered 2018-08-17: 500 mL via INTRAVENOUS

## 2018-08-17 NOTE — Progress Notes (Signed)
   Subjective:   Travis Page was examined and evaluated at bedside chair this AM. He was observed resting comfortably. He states whatever medication he took last night that was liquid helped his stomach, which was carafate. He mentions some abdominal discomfort after medication administration yesterday (miralax?) He states he is endorsing some loss of appetitie in the hospital but 'eats well at home.' He also endorse some neck pain on the posterior side as well as rhinorrhea. He states he thinks his sinus issues are contributing to his swallowing dysfunction.   Objective:  Vital signs in last 24 hours: Vitals:   08/16/18 2014 08/17/18 0100 08/17/18 0539 08/17/18 0544  BP: 134/70 (!) 141/66  133/70  Pulse: 98 99  94  Resp: 20 20  20   Temp: 98.7 F (37.1 C) 99.5 F (37.5 C)  100.1 F (37.8 C)  TempSrc: Oral Oral  Oral  SpO2: 97% 96%  97%  Weight:   79 kg   Height:       Constitution: NAD, sitting up in bed HENT: hard of hearing, white plaques improved from yesterday  Cardio: RRR, no w/r/r  Respiratory: CTA, no m/r/g  Abdominal: soft, non-distended, NTTP MSK: no edema, moving all extremities  Neuro: alert and oriented, normal affect  Skin: c/d/i   Assessment/Plan:  Active Problems:   Essential hypertension   History of colonic polyps   Dyspnea   Failure to thrive in adult   AKI (acute kidney injury) ([place])   Travis Page is a 78 y.o male with HTN, HLD, Shatzki's ring s/p esophageal dilation and GERD who presents with 2 months of worsening nausea and subsequent anorexia, and dyspnea on exertion.  Nausea, Anorexia, Fever Gastritis Esophageal Candidiasis Symptoms of abdominal pain improved with diflucan vs. Carafate. He does have thrush but symptoms not completely consistent with esophageal candidiasis. Gastritis is still a possibility and will refer back to his GI doctor at discharge who last saw him in January. With his weight loss I continue to have concern for malignancy  although he has had scan of his head and neck, abdomen and pelvis and has MM labs pending and had 3/4 CRABS criteria positive. His temperature is increasing today and he has spiked a fever. Additionally paged for seven beats of VTach on telemetry. ECHO done yesterday showed mild impaired relaxation and normal EF at 60%.   - blood cultures ordered  - cont. Telemetry  - cont. carafate prn  - cont. protonix 40 mg bid - cont. diflucan 14 days total  - am CBC, CMP - add on Mag  Acute Kidney Injury Microscopic Hematuria +Myoglobin decreased from previous without RBCs and low CK. No recent travel. Etiology unclear but resolving. His AKI has resolved, which was likely prerenal.   Iron Deficiency Anemia Start iron supplementation once fever and leukocytosis has resolved.   Constipation Cont. Senokot. Stopped miralax due to causing abdominal pain and bloating.   BPH Cont. Dutasteride   Hypertension CAD Cont. Asa 81 mg qd, fenofibrate 160 mg qd  VTE: lovenox  IVF: 500cc after imaging today  Diet: regular  Code: full   Dispo: Anticipated discharge pending clinical improvement.   Marty Heck, DO 08/17/2018, 6:26 AM Pager: [phone]

## 2018-08-17 NOTE — Evaluation (Signed)
Occupational Therapy Evaluation and Discharge Patient Details Name: Travis Page MRN: [id number] DOB: 1940/12/11 Today's Date: 08/17/2018    History of Present Illness Patient is a 78 y/o male who presents with weakness, cough, chills, hearing loss, SOB; has had multiple sinus infections. CTA chest-LLE atelectasis. PMH includes HTN, high cholesterol.   Clinical Impression   Pt is functioning independently. No OT or DME needs. Pt able to walk himself in hall.     Follow Up Recommendations  No OT follow up    Equipment Recommendations  None recommended by OT    Recommendations for Other Services       Precautions / Restrictions Precautions Precautions: None Restrictions Weight Bearing Restrictions: No      Mobility Bed Mobility Overal bed mobility: Modified Independent             General bed mobility comments: HOB up slightly  Transfers Overall transfer level: Independent Equipment used: None                  Balance Overall balance assessment: No apparent balance deficits (not formally assessed)                                         ADL either performed or assessed with clinical judgement   ADL Overall ADL's : Independent                                             Vision Patient Visual Report: No change from baseline       Perception     Praxis      Pertinent Vitals/Pain Pain Assessment: No/denies pain     Hand Dominance Right   Extremity/Trunk Assessment Upper Extremity Assessment Upper Extremity Assessment: Overall WFL for tasks assessed   Lower Extremity Assessment Lower Extremity Assessment: Overall WFL for tasks assessed   Cervical / Trunk Assessment Cervical / Trunk Assessment: Kyphotic   Communication Communication Communication: HOH   Cognition Arousal/Alertness: Awake/alert Behavior During Therapy: WFL for tasks assessed/performed Overall Cognitive Status: Within Functional Limits  for tasks assessed                                     General Comments       Exercises     Shoulder Instructions      Home Living Family/patient expects to be discharged to:: Private residence Living Arrangements: Spouse/significant other Available Help at Discharge: Family;Available 24 hours/day Type of Home: House Home Access: Stairs to enter [employer] of Steps: 2 Entrance Stairs-Rails: Right Home Layout: Two level;Able to live on main level with bedroom/bathroom   Alternate Level Stairs-Rails: Right Bathroom Shower/Tub: Tub/shower unit   Bathroom Toilet: Handicapped height     Home Equipment: Walker - 2 wheels;Grab bars - toilet;Shower seat;Bedside commode;Grab bars - tub/shower          Prior Functioning/Environment Level of Independence: Independent        Comments: Independent with ADLs, drives, does all household tasks. Wife has gait problems so helps her as well.        OT Problem List:        OT Treatment/Interventions:      OT Goals(Current goals  can be found in the care plan section) Acute Rehab OT Goals Patient Stated Goal: to find out what is making him sick  OT Frequency:     Barriers to D/C:            Co-evaluation              AM-PAC OT "6 Clicks" Daily Activity     Outcome Measure Help from another person eating meals?: None Help from another person taking care of personal grooming?: None Help from another person toileting, which includes using toliet, bedpan, or urinal?: None Help from another person bathing (including washing, rinsing, drying)?: None Help from another person to put on and taking off regular upper body clothing?: None Help from another person to put on and taking off regular lower body clothing?: None 6 Click Score: 24   End of Session Equipment Utilized During Treatment: Gait belt  Activity Tolerance: Patient tolerated treatment well Patient left: Other (comment)(walking in  halls)  OT Visit Diagnosis: Muscle weakness (generalized) (M62.81)                Time: 9150-5697 OT Time Calculation (min): 14 min Charges:  OT General Charges $OT Visit: 1 Visit OT Evaluation $OT Eval Low Complexity: 1 Low  Nestor Lewandowsky, OTR/L Acute Rehabilitation Services Pager: [phone] Office: [phone]  Malka So 08/17/2018, 2:45 PM

## 2018-08-17 NOTE — Progress Notes (Signed)
  Date: 08/17/2018  Patient name: Travis Page record number: [id number]  Date of birth: [birth year]   I have seen and evaluated this patient and I have discussed the plan of care with the house staff. Please see Dr. Aurelio Jew note for complete details. I concur with her findings and plan.  No clear reason for anorexia, nausea and weight loss have been found.  He is eating only a little bit of the food here, but he notes that this is because he does not like what is offered.  He is trying to drink ensure.  Epigastric pain improved with sucralfate, thrush improving with fluconazole.    Sid Falcon, MD 08/17/2018, 8:36 PM

## 2018-08-17 NOTE — Evaluation (Addendum)
Physical Therapy Evaluation Patient Details Name: Travis Page MRN: [id number] DOB: August 31, 1940 Today's Date: 08/17/2018   History of Present Illness  Patient is a 78 y/o male who presents with weakness, cough, chills, hearing loss, SOB; has had multiple sinus infections. CTA chest-LLE atelectasis. PMH includes HTN, high cholesterol.  Clinical Impression  Patient presents with fatigue and not feeling well. Tolerated transfers and gait training with supervision progressing to Mod I for safety requiring standing rest break. No SOB noted or reported. Mild instability noted but no overt LOB. HR ranged from 100-121 bpm, irregular with multiple PVCs. Reports he has been feeling weak at home due to not feeling well overall for a few weeks; due to a sinus infection per report. Pt is independent PTA, drives and cares for wife. Encouraged walking with nursing while in the hospital to maintain mobility/strength. Pt does not require skilled therapy services as pt functioning close to baseline. All education completed. Discharge from therapy.    Follow Up Recommendations No PT follow up;Supervision - Intermittent    Equipment Recommendations  None recommended by PT    Recommendations for Other Services       Precautions / Restrictions Precautions Precautions: None Restrictions Weight Bearing Restrictions: No      Mobility  Bed Mobility Overal bed mobility: Modified Independent             General bed mobility comments: No assist needed.   Transfers Overall transfer level: Modified independent Equipment used: None             General transfer comment: Stood from EOB without difficulty. Transferred to chair post ambulation.  Ambulation/Gait Ambulation/Gait assistance: Supervision;Modified independent (Device/Increase time) Gait Distance (Feet): 150 Feet Assistive device: None Gait Pattern/deviations: Step-through pattern;Narrow base of support;Drifts right/left Gait velocity:  decreased   General Gait Details: Slow, mostly steady gait. 1 standing rest break. no SOB today. HR ranged from 100-121 bpm, irregular with multiple PVCs.  Stairs            Wheelchair Mobility    Modified Rankin (Stroke Patients Only)       Balance Overall balance assessment: Needs assistance Sitting-balance support: Feet supported;No upper extremity supported Sitting balance-Leahy Scale: Good     Standing balance support: During functional activity Standing balance-Leahy Scale: Good                               Pertinent Vitals/Pain Pain Assessment: No/denies pain    Home Living Family/patient expects to be discharged to:: Private residence Living Arrangements: Spouse/significant other Available Help at Discharge: Family;Available 24 hours/day Type of Home: House Home Access: Stairs to enter Entrance Stairs-Rails: Right Entrance Stairs-Number of Steps: 2 Home Layout: Two level;Able to live on main level with bedroom/bathroom Home Equipment: Walker - 2 wheels;Grab bars - toilet;Shower seat;Bedside commode;Grab bars - tub/shower      Prior Function Level of Independence: Independent         Comments: Independent with ADLs, drives, does all household tasks. Wife has gait problems so helps her as well.     Hand Dominance        Extremity/Trunk Assessment   Upper Extremity Assessment Upper Extremity Assessment: Defer to OT evaluation    Lower Extremity Assessment Lower Extremity Assessment: Overall WFL for tasks assessed    Cervical / Trunk Assessment Cervical / Trunk Assessment: Kyphotic  Communication   Communication: HOH  Cognition Arousal/Alertness: Awake/alert Behavior During Therapy: WFL for  tasks assessed/performed Overall Cognitive Status: Within Functional Limits for tasks assessed                                        General Comments      Exercises     Assessment/Plan    PT Assessment Patent  does not need any further PT services  PT Problem List         PT Treatment Interventions      PT Goals (Current goals can be found in the Care Plan section)  Acute Rehab PT Goals Patient Stated Goal: to go home today PT Goal Formulation: All assessment and education complete, DC therapy    Frequency     Barriers to discharge        Co-evaluation               AM-PAC PT "6 Clicks" Mobility  Outcome Measure Help needed turning from your back to your side while in a flat bed without using bedrails?: None Help needed moving from lying on your back to sitting on the side of a flat bed without using bedrails?: None Help needed moving to and from a bed to a chair (including a wheelchair)?: None Help needed standing up from a chair using your arms (e.g., wheelchair or bedside chair)?: None Help needed to walk in hospital room?: None Help needed climbing 3-5 steps with a railing? : A Little 6 Click Score: 23    End of Session Equipment Utilized During Treatment: Gait belt Activity Tolerance: Patient tolerated treatment well Patient left: in chair;with call bell/phone within reach Nurse Communication: Mobility status PT Visit Diagnosis: Muscle weakness (generalized) (M62.81)    Time: 5974-1638 PT Time Calculation (min) (ACUTE ONLY): 17 min   Charges:   PT Evaluation $PT Eval Low Complexity: 1 Low          Wray Kearns, PT, DPT Acute Rehabilitation Services Pager [phone] Office [phone]      [place] 08/17/2018, 8:46 AM

## 2018-08-18 LAB — COMPREHENSIVE METABOLIC PANEL
ALT: 10 U/L (ref 0–44)
AST: 11 U/L — ABNORMAL LOW (ref 15–41)
Albumin: 2.6 g/dL — ABNORMAL LOW (ref 3.5–5.0)
Alkaline Phosphatase: 47 U/L (ref 38–126)
Anion gap: 10 (ref 5–15)
BUN: 9 mg/dL (ref 8–23)
CO2: 25 mmol/L (ref 22–32)
Calcium: 9.4 mg/dL (ref 8.9–10.3)
Chloride: 101 mmol/L (ref 98–111)
Creatinine, Ser: 1.12 mg/dL (ref 0.61–1.24)
GFR calc Af Amer: >60 mL/min (ref >60)
GFR calc non Af Amer: >60 mL/min (ref >60)
Glucose, Bld: 128 mg/dL — ABNORMAL HIGH (ref 70–99)
Potassium: 3.4 mmol/L — ABNORMAL LOW (ref 3.5–5.1)
Sodium: 136 mmol/L (ref 135–145)
Total Bilirubin: 0.6 mg/dL (ref 0.3–1.2)
Total Protein: 5.9 g/dL — ABNORMAL LOW (ref 6.5–8.1)

## 2018-08-18 LAB — CBC WITH DIFFERENTIAL/PLATELET
Abs Immature Granulocytes: 0.11 10*3/uL — ABNORMAL HIGH (ref 0.00–0.07)
Basophils Absolute: 0 10*3/uL (ref 0.0–0.1)
Basophils Relative: 0 %
Eosinophils Absolute: 0.1 10*3/uL (ref 0.0–0.5)
Eosinophils Relative: 1 %
HCT: 29.1 % — ABNORMAL LOW (ref 39.0–52.0)
Hemoglobin: 9.2 g/dL — ABNORMAL LOW (ref 13.0–17.0)
Immature Granulocytes: 1 %
Lymphocytes Relative: 11 %
Lymphs Abs: 1.2 10*3/uL (ref 0.7–4.0)
MCH: 24.9 pg — ABNORMAL LOW (ref 26.0–34.0)
MCHC: 31.6 g/dL (ref 30.0–36.0)
MCV: 78.9 fL — ABNORMAL LOW (ref 80.0–100.0)
Monocytes Absolute: 1 10*3/uL (ref 0.1–1.0)
Monocytes Relative: 9 %
Neutro Abs: 8.6 10*3/uL — ABNORMAL HIGH (ref 1.7–7.7)
Neutrophils Relative %: 78 %
Platelets: 519 10*3/uL — ABNORMAL HIGH (ref 150–400)
RBC: 3.69 MIL/uL — ABNORMAL LOW (ref 4.22–5.81)
RDW: 14.2 % (ref 11.5–15.5)
WBC: 11 10*3/uL — ABNORMAL HIGH (ref 4.0–10.5)
nRBC: 0 % (ref 0.0–0.2)

## 2018-08-18 LAB — PSA, TOTAL AND FREE
PSA, Free Pct: 19.4 %
PSA, Free: 0.66 ng/mL
Prostate Specific Ag, Serum: 3.4 ng/mL (ref 0.0–4.0)

## 2018-08-18 LAB — MAGNESIUM: Magnesium: 2.3 mg/dL (ref 1.7–2.4)

## 2018-08-18 LAB — TECHNOLOGIST SMEAR REVIEW

## 2018-08-18 MED ORDER — POTASSIUM CHLORIDE CRYS ER 20 MEQ PO TBCR
40.0000 meq | EXTENDED_RELEASE_TABLET | Freq: Two times a day (BID) | ORAL | Status: AC
  Administered 2018-08-18 (×2): 40 meq via ORAL
  Filled 2018-08-18 (×2): qty 2

## 2018-08-18 NOTE — Progress Notes (Signed)
Spoke with patients daughter, [name]. She stated the family had not been updated and was concerned. She asked if the patient was scheduled for MRI. Worried that the reasons for her father's decline have yet to be found. Put contact information on a sticky note for treatment team to see. Will tell oncoming shift to follow up with day team providers.

## 2018-08-18 NOTE — Progress Notes (Signed)
Noted that pt able to finish/100% of his dinner. Ensure still given at bedtime. No complains. Will monitor.

## 2018-08-18 NOTE — Progress Notes (Addendum)
Subjective:  Febrile to 101 overnight. Patient state he could feel the fever overnight. Had chills and sweats. He is otherwise doing well and has no complaints today. He asked that I call his daughter to inform her of plan.   Objective:  Vital signs in last 24 hours: Vitals:   08/17/18 1128 08/17/18 2022 08/17/18 2211 08/18/18 0438  BP: 129/77 140/73  119/71  Pulse: 96 (!) 106  (!) 104  Resp: 17 20  18   Temp: (!) 100.4 F (38 C) (!) 101 F (38.3 C) 99.4 F (37.4 C) 99.2 F (37.3 C)  TempSrc: Oral Oral Oral Oral  SpO2: 97% 95%  96%  Weight:    79 kg  Height:       General: elderly male, well-appearing, in bed watching TV in no acute distress Mouth: thrush improving  CV: RRR, nl S1/S2, no mrg  Pulm: CTAB, comfortable on RA  Abd: soft, NTND  Ext: warm and well perfused without edema  Skin: no new rashes or lesions, no lymphadenopathy    Assessment/Plan:  Principal Problem:   Dyspnea Active Problems:   Essential hypertension   History of colonic polyps   Failure to thrive in adult   AKI (acute kidney injury) ([facility])  Mr. Tri Chittick is a 78 y.o male with HTN, HLD, Shatzkis ring s/p esophageal dilation and GERD who presents with 2 months of worsening nausea and subsequent anorexia, and dyspnea on exertion.  # Nausea, Anorexia, Fever, weight loss  Concern for malignancy. Workup has been negative thus far including: CT of head, neck, chest and abdomen/pelvis, HIV, HCV Ab, CXR, and echocardiogram. Kappa levels slightly elevated but kappa/lambda ratio is normal. IFE in process. Spoke to patient's daughter [name] and discussed concern for malignancy. She did not seem surprised and stated she has been concerned about this due to his marked weight loss. 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 I discussed we are ruling out infection at this time due to new fever and that we may reach out to oncology for  further recommendations on work-up. Also discussed he needs close follow up with PCP and dermatology.   - Follow up Bcx  - Continue Protonix + Diflucan + carafe PRN   - Will continue to work on PO intake   # ? Esophageal Candidiasis Patient has thrush on exam though no pain with swallowing typical of esophageal candidiasis. His PO intake has mildly improved with PO fluconazole. Will continue. Unfortunately, he does not like hospital food and is having a difficult time understanding nutrition staff due to hearing problems. Spoke with his nurse to help him order food today.  - Fluconazole 200 mg QD  - HIV negative   # VT run: Tele reviewed. 8-beat run of  yesterday. Patiet was asymptomatic. K and Mag were low and repleted. Will continue to monitor electrolytes and replete PRN.   # Acute Kidney Injury: Resolved.   # Hemoglobin urine: Unclear etiology. No RBCs and low CK. Recommend repeating UA in the outpatient setting.   # Iron Deficiency Anemia: Hgb stable in the 9s.Start iron supplementation once infection has been ruled out. Will follow up blood cx.   # Constipation: Continue senokot.   # BPH: Continue home dutasteride.   # Hypertension # CAD Continue ASA 81 mg QD and fenofibrate 160 mg QD.    Dispo: Anticipated discharge in approximately 1-2 day(s).   Santos-Sanchez,  Merlene Morse, MD 08/18/2018, 7:13 AM Pager: [phone]

## 2018-08-18 NOTE — Plan of Care (Signed)
?  Problem: Clinical Measurements: ?Goal: Ability to maintain clinical measurements within normal limits will improve ?Outcome: Progressing ?Goal: Will remain free from infection ?Outcome: Progressing ?Goal: Diagnostic test results will improve ?Outcome: Progressing ?  ?

## 2018-08-18 NOTE — Progress Notes (Signed)
   Vital Signs MEWS/VS Documentation      08/18/2018 1748 08/18/2018 1929 08/18/2018 2021 08/18/2018 2204   MEWS Score:  0  0  3  0   MEWS Score Color:  Green  Green  Yellow  Green   Resp:  18  -  18  -   Pulse:  66  -  (!) 112  88   BP:  (!) 127/51  -  96/85  130/60   Temp:  99.6 F (37.6 C)  -  99.7 F (37.6 C)  -   O2 Device:  Room Air  -  Room Air  -     Pt denies lightheadedness, no chest pain and denies SOB. Pt at rest by the time Last BP taken. Will monitor  Reginold Beale Lyn D Tejasvi Brissett 08/18/2018,10:08 PM

## 2018-08-19 LAB — COMPREHENSIVE METABOLIC PANEL
ALT: 11 U/L (ref 0–44)
AST: 11 U/L — ABNORMAL LOW (ref 15–41)
Albumin: 2.6 g/dL — ABNORMAL LOW (ref 3.5–5.0)
Alkaline Phosphatase: 56 U/L (ref 38–126)
Anion gap: 9 (ref 5–15)
BUN: 11 mg/dL (ref 8–23)
CO2: 24 mmol/L (ref 22–32)
Calcium: 9.3 mg/dL (ref 8.9–10.3)
Chloride: 102 mmol/L (ref 98–111)
Creatinine, Ser: 1.02 mg/dL (ref 0.61–1.24)
GFR calc Af Amer: >60 mL/min (ref >60)
GFR calc non Af Amer: >60 mL/min (ref >60)
Glucose, Bld: 132 mg/dL — ABNORMAL HIGH (ref 70–99)
Potassium: 4.2 mmol/L (ref 3.5–5.1)
Sodium: 135 mmol/L (ref 135–145)
Total Bilirubin: 0.6 mg/dL (ref 0.3–1.2)
Total Protein: 5.9 g/dL — ABNORMAL LOW (ref 6.5–8.1)

## 2018-08-19 LAB — MAGNESIUM: Magnesium: 1.8 mg/dL (ref 1.7–2.4)

## 2018-08-19 LAB — CBC
HCT: 30 % — ABNORMAL LOW (ref 39.0–52.0)
Hemoglobin: 9.4 g/dL — ABNORMAL LOW (ref 13.0–17.0)
MCH: 24.8 pg — ABNORMAL LOW (ref 26.0–34.0)
MCHC: 31.3 g/dL (ref 30.0–36.0)
MCV: 79.2 fL — ABNORMAL LOW (ref 80.0–100.0)
Platelets: 574 10*3/uL — ABNORMAL HIGH (ref 150–400)
RBC: 3.79 MIL/uL — ABNORMAL LOW (ref 4.22–5.81)
RDW: 14.1 % (ref 11.5–15.5)
WBC: 11.2 10*3/uL — ABNORMAL HIGH (ref 4.0–10.5)
nRBC: 0 % (ref 0.0–0.2)

## 2018-08-19 LAB — SEDIMENTATION RATE: Sed Rate: 46 mm/hr — ABNORMAL HIGH (ref 0–16)

## 2018-08-19 LAB — C-REACTIVE PROTEIN: CRP: 18.7 mg/dL — ABNORMAL HIGH (ref <1.0)

## 2018-08-19 NOTE — Progress Notes (Signed)
   Subjective: The patient denied fever, chills or sweating overnight. He endorses feeling hungry but stating that the food he orders is not palatable and he will not eat what he did not order. He stated that he has always consumed very little food daily typically only eating 2 meals a day.   Objective:  Vital signs in last 24 hours: Vitals:   08/18/18 1748 08/18/18 2021 08/18/18 2204 08/19/18 0501  BP: (!) 127/51 96/85 130/60 122/81  Pulse: 66 (!) 112 88 (!) 104  Resp: '18 18  18  '$ Temp: 99.6 F (37.6 C) 99.7 F (37.6 C)  98.9 F (37.2 C)  TempSrc: Oral Oral  Oral  SpO2: 98% 96%  94%  Weight:      Height:       General: A/O x4, in no acute distress, afebrile, nondiaphoretic Cardio: RRR, no mrg's  Pulmonary: CTA bilaterally, no wheezing or crackles  Abdomen: Bowel sounds normal, soft, nontender  MSK: BLE nontender, nonedematous Neuro: Alert, conversational Psych: Appropriate affect, not depressed in appearance, engages well  Assessment/Plan:  Principal Problem:   Dyspnea Active Problems:   Essential hypertension   History of colonic polyps   Failure to thrive in adult   AKI (acute kidney injury) [facility])  [name] is a 78 year old male with a past medical history significant for HTN, HLD, Schatzki's ring s/p esophageal dilation and GERD who presents with 2 months of worsening nausea, intermittent fevers, anorexia, weight loss and dyspnea on exertion.  Plan: Nausea, anorexia, fever, weight loss: Concerning for malignancy but no definitive mass, lesion or abnormality identified after extensive evaluation.  Given the intermittent fevers, fatigue, anorexia, weight loss and diaphoresis for the past 2 months there is concern for endocarditis, although less likely.  Have updated patient's daughter Travis Page that the patient remains fever free with negative blood cultures and will likely be discharged home in the next day or 2 with close outpatient follow-up with his PCP, dermatology and  considering oncology given possibility of a primary hematologic process.  Echocardiogram unremarkable, CT of the head neck chest and abdomen/pelvis unremarkable. Immature granulocytes noted on Diff, could be 2/2 to primary hematologic neoplasm, infection or inflammatory disease.  - Initial blood cultures are negative at 24 hours -Repeat blood cultures now at 48-hour mark -Ordered ESR, CRP and RA -Continue Protonix, Diflucan and Carafate as needed  Esophageal candidiasis: Patient remains asymptomatic stating that that there is no odynophagia. No clear evidence of immunosuppression. Uncertain etiology. -Continue fluconazole to milligrams daily -HIV, HCV negative  Diet: Regular Code: Full Fluids: N/A DVT P PX: Enoxaparin Dispo: Anticipated discharge in approximately 1-2 day(s).   Kathi Ludwig, MD 08/19/2018, 11:33 AM Pager: Pager# [phone]

## 2018-08-19 NOTE — Progress Notes (Signed)
  Date: 08/19/2018  Patient name: Travis Page  Medical record number: [id number]  Date of birth: Apr 30, 1940   I have seen and evaluated this patient and I have discussed the plan of care with the house staff. Please see their note for complete details.  Lenice Pressman, M.D., Ph.D. 08/19/2018, 1:29 PM

## 2018-08-20 DIAGNOSIS — H9190 Unspecified hearing loss, unspecified ear: Secondary | ICD-10-CM

## 2018-08-20 DIAGNOSIS — D72829 Elevated white blood cell count, unspecified: Secondary | ICD-10-CM

## 2018-08-20 LAB — BLOOD CULTURE ID PANEL (REFLEXED)

## 2018-08-20 LAB — IMMUNOFIXATION ELECTROPHORESIS
IgA: 155 mg/dL (ref 61–437)
IgG (Immunoglobin G), Serum: 708 mg/dL (ref 603–1613)
IgM (Immunoglobulin M), Srm: 47 mg/dL (ref 15–143)
Total Protein ELP: 5.3 g/dL — ABNORMAL LOW (ref 6.0–8.5)

## 2018-08-20 LAB — RHEUMATOID FACTOR: Rhuematoid fact SerPl-aCnc: 18.5 IU/mL — ABNORMAL HIGH (ref 0.0–13.9)

## 2018-08-20 MED ORDER — FLUCONAZOLE 200 MG PO TABS: 200.0000 mg | ORAL_TABLET | Freq: Every day | ORAL | 0 refills | Status: DC

## 2018-08-20 NOTE — Discharge Summary (Signed)
Name: Travis Page MRN: [id number] DOB: 10-16-1940 78 y.o. PCP: Curlene Labrum, MD  Date of Admission: 08/15/2018  5:40 PM Date of Discharge:  08/20/2018 Attending Physician: Oda Kilts, MD  Discharge Diagnosis: 1.  Nausea, anorexia, fever, weight loss 2.  Esophageal candidiasis  Discharge Medications: Allergies as of 08/20/2018      Reactions   Statins Other (See Comments)   Muscle aches.      Medication List    TAKE these medications   aspirin EC 81 MG tablet Take 1 tablet (81 mg total) by mouth at bedtime.   cyanocobalamin 1000 MCG/ML injection Commonly known as:  (VITAMIN B-12) Inject 1,000 mcg into the muscle every 30 (thirty) days.   dutasteride 0.5 MG capsule Commonly known as:  AVODART Take 0.5 mg by mouth daily at 3 pm.   fenofibrate 160 MG tablet Take 160 mg by mouth daily.   fluconazole 200 MG tablet Commonly known as:  DIFLUCAN Take 1 tablet (200 mg total) by mouth daily. Start taking on:  August 21, 2018   HYDROcodone-acetaminophen 10-325 MG tablet Commonly known as:  NORCO Take 1 tablet by mouth every 6 (six) hours as needed (for pain).   losartan 50 MG tablet Commonly known as:  COZAAR Take 50 mg by mouth daily at 3 pm.   pantoprazole 40 MG tablet Commonly known as:  PROTONIX Take 1 tablet (40 mg total) by mouth daily before breakfast.   VITAMIN D3 PO Take 1 tablet by mouth daily.   zolpidem 10 MG tablet Commonly known as:  AMBIEN Take 10 mg by mouth at bedtime.       Disposition and follow-up:   Travis Page was discharged from [facility] in [place] condition.  At the hospital follow up visit please address:  1.  Nausea, anorexia, fever, weight loss: Still no clear etiology despite thorough work-up including negative blood cultures, negative pan CT scan.  Did have elevated inflammatory markers (CRP, ESR).  Working Public house manager includes rheumatologic disorder, malignancy.  - Consider hematology and oncology  follow-up - Consider rheumatology follow-up - Consider dermatology follow-up given remote history of melanoma      Esophageal candidiasis-continue Diflucan      Presbycusis/hearing loss: CT head without evidence of mass compression.  Upon medication review, losartan has a rare side effect of hearing loss.  Can consider an alternative antihypertensive.  2.  Labs / imaging needed at time of follow-up:  None  3.  Pending labs/ test needing follow-up: Immunofixation electrophoresis, rheumatoid factor, pathology blood smear,  Follow-up Appointments: Follow-up Information    Burdine, Virgina Evener, MD. Go on 08/22/2018.   Specialty:  Family Medicine Why:  @11 :Max Sane information: [address] [place] [address] [place] by problem list: 1.  Nausea, anorexia, fever, weight loss  Travis Page is a 78 y.o male with HTN, HLD, Shatzki's ring s/p esophageal dilation and GERD who who presented to [facility] on Aug 16, 2018 with worsening nausea, dyspnea on exertion, anorexia and a 20 pound weight loss over a 25-monthinterval.  Initial laboratory exams included leukocytosis with neutrophil predominance, elevated ferritin and thrombocytosis.  CT of the head, neck, chest, abdomen and pelvis was unremarkable.  HIV, hepatitis C antibody, TSH, Lipase, SARS-CoV-2 negative, iron low at 10, low saturation of 4, elevated ferritin of 353, vitamin B12 normal, initial PSA mildly elevated at 4.22, free and total  PSA normal, lactic acid negative, chest x-ray and echocardiogram were all unremarkable.  He did have slightly elevated kappa levels as we are concerned about multiple myeloma however kappa/lambda ratio were normal.  He was positive for 3 out of 4 of crab criteria for multiple myeloma (elevated creatinine, anemia, mild hypercalcemia). though he continued to spike intermittent fevers, initial blood cultures and repeat blood cultures were all negative.  He had  hemoglobinuria on initial urinalysis however repeat urinalysis and CK were unremarkable.  He was noted to have elevated ESR and CRP.  Our working theory includes possible infection, malignancy and/or rheumatologic disorder. In addition he had an Immature granulocytes noted on Differential which could be secondary to primary hematologic neoplasm, infection or inflammatory disease.   He was managed with Protonix, Diflucan and Carafate, azithromycin, x1 dose.   2.  Esophageal candidiasis: He was noted to have oral thrush.  Work-up with HIV, hepatitis C antibody were all unremarkable.  He was managed on fluconazole.  Discharge Vitals:   BP 135/79 (BP Location: Left Arm)   Pulse (!) 101   Temp 98.6 F (37 C) (Oral)   Resp 18   Ht 6' (1.829 m)   Wt 77.2 kg Comment: scale b  SpO2 98%   BMI 23.08 kg/m   Pertinent Labs, Studies, and Procedures:  CBC Latest Ref Rng & Units 08/19/2018 08/18/2018 08/17/2018  WBC 4.0 - 10.5 K/uL 11.2(H) 11.0(H) 12.5(H)  Hemoglobin 13.0 - 17.0 g/dL 9.4(L) 9.2(L) 9.7(L)  Hematocrit 39.0 - 52.0 % 30.0(L) 29.1(L) 31.6(L)  Platelets 150 - 400 K/uL 574(H) 519(H) 603(H)   CMP Latest Ref Rng & Units 08/19/2018 08/18/2018 08/17/2018  Glucose 70 - 99 mg/dL 132(H) 128(H) 122(H)  BUN 8 - 23 mg/dL 11 9 9   Creatinine 0.61 - 1.24 mg/dL 1.02 1.12 1.14  Sodium 135 - 145 mmol/L 135 136 137  Potassium 3.5 - 5.1 mmol/L 4.2 3.4(L) 3.8  Chloride 98 - 111 mmol/L 102 101 102  CO2 22 - 32 mmol/L 24 25 25   Calcium 8.9 - 10.3 mg/dL 9.3 9.4 9.6  Total Protein 6.5 - 8.1 g/dL 5.9(L) 5.9(L) 6.0(L)  Total Bilirubin 0.3 - 1.2 mg/dL 0.6 0.6 0.6  Alkaline Phos 38 - 126 U/L 56 47 50  AST 15 - 41 U/L 11(L) 11(L) 12(L)  ALT 0 - 44 U/L 11 10 9     EXAM: CT HEAD WITHOUT CONTRAST  TECHNIQUE: Contiguous axial images were obtained from the base of the skull through the vertex without intravenous contrast.  COMPARISON:  None.  FINDINGS: Brain: Mild atrophic changes are noted. No findings to  suggest acute hemorrhage, acute infarction or space-occupying mass lesion are seen.  Vascular: No hyperdense vessel or unexpected calcification.  Skull: Normal. Negative for fracture or focal lesion.  Sinuses/Orbits: No acute finding.  Other: None.  IMPRESSION: Atrophic changes without acute abnormality.   EXAM: CT ANGIOGRAPHY CHEST  CT ABDOMEN AND PELVIS WITH CONTRAST  TECHNIQUE: Multidetector CT imaging of the chest was performed using the standard protocol during bolus administration of intravenous contrast. Multiplanar CT image reconstructions and MIPs were obtained to evaluate the vascular anatomy. Multidetector CT imaging of the abdomen and pelvis was performed using the standard protocol during bolus administration of intravenous contrast.  CONTRAST:  61m OMNIPAQUE IOHEXOL 300 MG/ML  SOLN  COMPARISON:  None.  FINDINGS: CTA CHEST FINDINGS  Cardiovascular: Thoracic aorta demonstrates mild atherosclerotic calcifications without aneurysmal dilatation or dissection. No cardiac enlargement is seen. Scattered coronary calcifications are noted. The pulmonary artery  shows no focal infiltrate or sizable effusion.  Mediastinum/Nodes: Thoracic inlet is within normal limits. No hilar or mediastinal adenopathy is seen. The esophagus as visualized is within normal limits.  Lungs/Pleura: Mild left lower lobe atelectatic changes are noted. The lungs are otherwise within normal limits. No sizable effusion or pneumothorax is seen.  Musculoskeletal: Degenerative changes of the thoracic spine are noted.  Review of the MIP images confirms the above findings.  CT ABDOMEN and PELVIS FINDINGS  Hepatobiliary: Fatty infiltration of the liver is noted. The gallbladder has been surgically removed.  Pancreas: Unremarkable. No pancreatic ductal dilatation or surrounding inflammatory changes.  Spleen: Normal in size without focal abnormality.   Adrenals/Urinary Tract: Adrenal glands are within normal limits. Renal cystic changes are noted bilaterally slightly worse on the left than the right. The largest of these cysts measures 5 cm in greatest dimension. No definitive renal calculi or obstructive changes are seen. The ureters are within normal limits. The bladder is partially distended.  Stomach/Bowel: Scattered diverticular change of the colon is noted. No obstructive or inflammatory changes are seen. The appendix is not well appreciated. No inflammatory changes are seen. Small bowel is within normal limits. Stomach is unremarkable.  Vascular/Lymphatic: Aortic atherosclerosis. No enlarged abdominal or pelvic lymph nodes.  Reproductive: Prostate is unremarkable.  Other: No abdominal wall hernia or abnormality. No abdominopelvic ascites.  Musculoskeletal: Degenerative changes of lumbar spine are noted. No acute bony abnormality is seen.  Review of the MIP images confirms the above findings.  IMPRESSION: CTA of the chest: No evidence of pulmonary emboli.  Mild left lower lobe atelectatic changes.  CT of the abdomen and pelvis: Chronic changes as described above. No acute abnormality noted.  Aortic Atherosclerosis (ICD10-I70.0).   EXAM: CT MAXILLOFACIAL WITH CONTRAST  TECHNIQUE: Multidetector CT imaging of the maxillofacial structures was performed with intravenous contrast. Multiplanar CT image reconstructions were also generated.  CONTRAST:  64m OMNIPAQUE IOHEXOL 300 MG/ML  SOLN  COMPARISON:  None.  FINDINGS: Osseous: Negative for fracture or mass.  Patient is edentulous.  Orbits: Normal bony orbit. Orbital soft tissues normal without mass or edema. Bilateral cataract surgery.  Sinuses: Mild mucosal edema paranasal sinuses.  No air-fluid level.  Soft tissues: Negative for soft tissue mass or adenopathy.  Limited intracranial: No acute abnormality.  IMPRESSION: No mass or  adenopathy.  No acute abnormality  Mild mucosal edema paranasal sinuses.   EXAM: CT NECK WITH CONTRAST  TECHNIQUE: Multidetector CT imaging of the neck was performed using the standard protocol following the bolus administration of intravenous contrast.  CONTRAST:  846mOMNIPAQUE IOHEXOL 300 MG/ML  SOLN  COMPARISON:  None.  FINDINGS: Pharynx and larynx: Normal. No mass or swelling.  Salivary glands: No inflammation, mass, or stone.  Thyroid: Negative  Lymph nodes: No enlarged lymph nodes in the neck.  Vascular: Normal vascular enhancement. Mild atherosclerotic disease carotid bifurcation bilaterally.  Limited intracranial: Negative  Visualized orbits: No orbital lesion.  Bilateral cataract surgery.  Mastoids and visualized paranasal sinuses: Mild mucosal edema paranasal sinuses.  Skeleton: No acute skeletal abnormality. Disc and facet degeneration in the cervical spine.  Upper chest: Lung apices clear bilaterally  Other: None  IMPRESSION: Negative for mass or adenopathy in neck.  No acute abnormality.  Mild atherosclerotic disease carotid bifurcation bilaterally.  Discharge Instructions: Discharge Instructions    Call MD for:  persistant dizziness or light-headedness   Complete by:  As directed    Call MD for:  persistant nausea and vomiting   Complete  by:  As directed    Call MD for:  severe uncontrolled pain   Complete by:  As directed    Diet - low sodium heart healthy   Complete by:  As directed    Discharge instructions   Complete by:  As directed    Travis Page,   It was a pleasure taking care of you here at the hospital.  You were admitted because of the decreased appetite and weight loss she had been experiencing for the past several months.  We did not have a clear reason why this could be going on.  From the blood work and imaging that we did, there was no clear source of infection as your blood cultures were negative.  One of  the lab tests we did was was to look for inflammation.  These tests are called ESR and CRP.  They came back elevated and these lab results can be elevated with rheumatologic disorders, infection or cancer.  I have talked to Dr. Pleas Koch and updated him.  He will repeat blood cultures for you. Please follow through with your dermatology appointment tomorrow and follow-up with Dr. Pleas Koch on Wednesday.  Take Care Dr. Eileen Stanford   Increase activity slowly   Complete by:  As directed       Signed: Jean Rosenthal, MD 08/20/2018, 11:47 AM   Pager: [phone] IMT[facility] PGY-1

## 2018-08-20 NOTE — Care Management Important Message (Signed)
Important Message  Patient Details  Name: MANOLITO JUREWICZ MRN: [id number] Date of Birth: 1940/11/05   Medicare Important Message Given:  Yes    Macedonio Scallon [address] 08/20/2018, 4:03 PM

## 2018-08-20 NOTE — Progress Notes (Signed)
   Subjective: HD#4   Overnight: Mentions had a fever (100F) around 8pm last night which coincides with overnight vital checks.  Today, Travis Page was examined and evaluated at bedside this AM. He states he feels 'pretty good' but expresses concern about having eaten well but continuing to lose weight. He also states his mouth feels dry all the time. He mentions breakfast was fine. Denies any chest pain, palpitations, dyspnea, abdominal pain, headache, nausea, vomiting. He expresses wishes to be discharged soon.  Objective:  Vital signs in last 24 hours: Vitals:   08/19/18 1142 08/19/18 2019 08/19/18 2300 08/20/18 0456  BP: 132/72 (!) 151/80  126/75  Pulse: 91 (!) 105  (!) 102  Resp: 20     Temp: 99.3 F (37.4 C) 100 F (37.8 C) 98.1 F (36.7 C) 98.6 F (37 C)  TempSrc: Oral Oral Oral Oral  SpO2: 99% 94%  97%  Weight:    77.2 kg  Height:       Const: In no apparent distress, seems frustrated HEENT: Atraumatic, normocephalic CV: RRR, no murmurs, gallops, rubs Abd: Bowel sounds present, nontender to palpation Ext: No lower extremity edema  Assessment/Plan:  Principal Problem:   Dyspnea Active Problems:   Essential hypertension   History of colonic polyps   Failure to thrive in adult   AKI (acute kidney injury) [facility])  [name] is a 78 year old male with a past medical history significant for HTN, HLD, Schatzki's ring s/p esophageal dilation and GERD who presents with 2 months of worsening nausea, intermittent fevers, anorexia, weight loss and dyspnea on exertion.  #Nausea, anorexia, fever, weight loss: He had a low grade temperature at 100F around 8 PM last night.  Today, he expressed frustration regarding not knowing the clear etiology of his longstanding weight loss.  He currently denies nausea, vomiting.  He expresses that he would like to be discharged and have follow-up with his PCP Dr. Pleas Page.  As stated previously, there is no clear etiology for his weight loss.   Some of the working theory is include subacute endocarditis versus a hematologic neoplasm though he does not have a significantly elevated white cell count which will indicate leukemia.  In addition given his elevated CRP and ESR, he could have some form of rheumatologic disorder.  So far all his blood cultures have remained negative.  I spoke to his PCP Dr. Pleas Page on the phone this morning and updated him of all his work-up and management.  I updated him regarding our plan for close follow-up.  Dr. Pleas Page was concerned about his hearing loss and had requested for medication review for possible ototoxic medications.  Upon thorough review -Plan to discharge today with repeat blood culture later this week -Follow-up with PCP -Consider oncology consult to work-up for possible underlining hematologic malignancy -Consider rheumatologic consult  -Follow-up RA -Continue Protonix, Diflucan and Carafate as needed. -Follow-up RA factor  #Esophageal candidiasis: On physical exams, he still has persistent oral thrush. -Continue fluconazole daily -HIV, HCV negative  Diet: Regular Code: Full Fluids: N/A DVT P PX: Enoxaparin Dispo: Anticipated discharge in approximately 1-2 day(s).   Travis Rosenthal, MD 08/20/2018, 6:13 AM Pager: [phone] IMTS PGY-1

## 2018-08-21 DIAGNOSIS — L570 Actinic keratosis: Secondary | ICD-10-CM | POA: Diagnosis not present

## 2018-08-21 DIAGNOSIS — Z85828 Personal history of other malignant neoplasm of skin: Secondary | ICD-10-CM | POA: Diagnosis not present

## 2018-08-22 DIAGNOSIS — R0600 Dyspnea, unspecified: Secondary | ICD-10-CM | POA: Diagnosis not present

## 2018-08-22 DIAGNOSIS — R630 Anorexia: Secondary | ICD-10-CM | POA: Diagnosis not present

## 2018-08-22 DIAGNOSIS — R509 Fever, unspecified: Secondary | ICD-10-CM | POA: Diagnosis not present

## 2018-08-22 DIAGNOSIS — K222 Esophageal obstruction: Secondary | ICD-10-CM | POA: Diagnosis not present

## 2018-08-22 DIAGNOSIS — M47812 Spondylosis without myelopathy or radiculopathy, cervical region: Secondary | ICD-10-CM | POA: Diagnosis not present

## 2018-08-22 LAB — CULTURE, BLOOD (ROUTINE X 2)
Culture: NO GROWTH
Culture: NO GROWTH
Special Requests: ADEQUATE
Special Requests: ADEQUATE
Special Requests: ADEQUATE

## 2018-08-23 ENCOUNTER — Other Ambulatory Visit: Payer: Self-pay

## 2018-08-23 ENCOUNTER — Ambulatory Visit (INDEPENDENT_AMBULATORY_CARE_PROVIDER_SITE_OTHER): Payer: Medicare Other | Admitting: Otolaryngology

## 2018-08-23 DIAGNOSIS — H903 Sensorineural hearing loss, bilateral: Secondary | ICD-10-CM | POA: Diagnosis not present

## 2018-08-23 DIAGNOSIS — H9209 Otalgia, unspecified ear: Secondary | ICD-10-CM

## 2018-08-24 LAB — CULTURE, BLOOD (ROUTINE X 2)
Culture: NO GROWTH
Special Requests: ADEQUATE

## 2018-08-27 DIAGNOSIS — R627 Adult failure to thrive: Secondary | ICD-10-CM | POA: Diagnosis not present

## 2018-08-27 DIAGNOSIS — R490 Dysphonia: Secondary | ICD-10-CM | POA: Diagnosis not present

## 2018-08-27 DIAGNOSIS — H04123 Dry eye syndrome of bilateral lacrimal glands: Secondary | ICD-10-CM | POA: Diagnosis not present

## 2018-08-27 DIAGNOSIS — H9203 Otalgia, bilateral: Secondary | ICD-10-CM | POA: Diagnosis not present

## 2018-08-27 DIAGNOSIS — Z789 Other specified health status: Secondary | ICD-10-CM | POA: Diagnosis not present

## 2018-08-27 DIAGNOSIS — R109 Unspecified abdominal pain: Secondary | ICD-10-CM | POA: Diagnosis not present

## 2018-08-27 DIAGNOSIS — Z720 Tobacco use: Secondary | ICD-10-CM | POA: Diagnosis not present

## 2018-08-27 DIAGNOSIS — R771 Abnormality of globulin: Secondary | ICD-10-CM | POA: Diagnosis not present

## 2018-08-27 DIAGNOSIS — R978 Other abnormal tumor markers: Secondary | ICD-10-CM | POA: Diagnosis not present

## 2018-08-27 DIAGNOSIS — R932 Abnormal findings on diagnostic imaging of liver and biliary tract: Secondary | ICD-10-CM | POA: Diagnosis not present

## 2018-08-28 DIAGNOSIS — R627 Adult failure to thrive: Secondary | ICD-10-CM | POA: Diagnosis not present

## 2018-08-28 DIAGNOSIS — R109 Unspecified abdominal pain: Secondary | ICD-10-CM | POA: Diagnosis not present

## 2018-08-29 DIAGNOSIS — Z111 Encounter for screening for respiratory tuberculosis: Secondary | ICD-10-CM | POA: Diagnosis not present

## 2018-08-31 DIAGNOSIS — F17290 Nicotine dependence, other tobacco product, uncomplicated: Secondary | ICD-10-CM | POA: Diagnosis not present

## 2018-08-31 DIAGNOSIS — H938X3 Other specified disorders of ear, bilateral: Secondary | ICD-10-CM | POA: Diagnosis not present

## 2018-08-31 DIAGNOSIS — J029 Acute pharyngitis, unspecified: Secondary | ICD-10-CM | POA: Diagnosis not present

## 2018-08-31 DIAGNOSIS — R634 Abnormal weight loss: Secondary | ICD-10-CM | POA: Diagnosis not present

## 2018-09-03 DIAGNOSIS — H9203 Otalgia, bilateral: Secondary | ICD-10-CM | POA: Diagnosis not present

## 2018-09-03 DIAGNOSIS — D3A00 Benign carcinoid tumor of unspecified site: Secondary | ICD-10-CM | POA: Diagnosis not present

## 2018-09-03 DIAGNOSIS — R779 Abnormality of plasma protein, unspecified: Secondary | ICD-10-CM | POA: Diagnosis not present

## 2018-09-03 DIAGNOSIS — R490 Dysphonia: Secondary | ICD-10-CM | POA: Diagnosis not present

## 2018-09-03 DIAGNOSIS — R627 Adult failure to thrive: Secondary | ICD-10-CM | POA: Diagnosis not present

## 2018-09-03 DIAGNOSIS — I776 Arteritis, unspecified: Secondary | ICD-10-CM | POA: Diagnosis not present

## 2018-09-05 DIAGNOSIS — R630 Anorexia: Secondary | ICD-10-CM | POA: Diagnosis not present

## 2018-09-05 DIAGNOSIS — I776 Arteritis, unspecified: Secondary | ICD-10-CM | POA: Diagnosis not present

## 2018-09-05 DIAGNOSIS — R0600 Dyspnea, unspecified: Secondary | ICD-10-CM | POA: Diagnosis not present

## 2018-09-05 DIAGNOSIS — K222 Esophageal obstruction: Secondary | ICD-10-CM | POA: Diagnosis not present

## 2018-09-10 DIAGNOSIS — I776 Arteritis, unspecified: Secondary | ICD-10-CM | POA: Diagnosis not present

## 2018-09-11 DIAGNOSIS — F329 Major depressive disorder, single episode, unspecified: Secondary | ICD-10-CM | POA: Diagnosis not present

## 2018-09-11 DIAGNOSIS — Z79899 Other long term (current) drug therapy: Secondary | ICD-10-CM | POA: Diagnosis not present

## 2018-09-11 DIAGNOSIS — N400 Enlarged prostate without lower urinary tract symptoms: Secondary | ICD-10-CM | POA: Diagnosis not present

## 2018-09-11 DIAGNOSIS — K219 Gastro-esophageal reflux disease without esophagitis: Secondary | ICD-10-CM | POA: Diagnosis not present

## 2018-09-11 DIAGNOSIS — Z9049 Acquired absence of other specified parts of digestive tract: Secondary | ICD-10-CM | POA: Diagnosis not present

## 2018-09-11 DIAGNOSIS — Z1159 Encounter for screening for other viral diseases: Secondary | ICD-10-CM | POA: Diagnosis not present

## 2018-09-11 DIAGNOSIS — G4700 Insomnia, unspecified: Secondary | ICD-10-CM | POA: Diagnosis not present

## 2018-09-11 DIAGNOSIS — Z96651 Presence of right artificial knee joint: Secondary | ICD-10-CM | POA: Diagnosis not present

## 2018-09-11 DIAGNOSIS — Z7982 Long term (current) use of aspirin: Secondary | ICD-10-CM | POA: Diagnosis not present

## 2018-09-11 DIAGNOSIS — I10 Essential (primary) hypertension: Secondary | ICD-10-CM | POA: Diagnosis not present

## 2018-09-11 DIAGNOSIS — I776 Arteritis, unspecified: Secondary | ICD-10-CM | POA: Diagnosis not present

## 2018-09-11 DIAGNOSIS — D649 Anemia, unspecified: Secondary | ICD-10-CM | POA: Diagnosis not present

## 2018-09-12 ENCOUNTER — Other Ambulatory Visit (HOSPITAL_COMMUNITY): Payer: Self-pay | Admitting: Oncology

## 2018-09-12 ENCOUNTER — Other Ambulatory Visit: Payer: Self-pay | Admitting: Oncology

## 2018-09-12 DIAGNOSIS — H15012 Anterior scleritis, left eye: Secondary | ICD-10-CM | POA: Diagnosis not present

## 2018-09-13 DIAGNOSIS — K219 Gastro-esophageal reflux disease without esophagitis: Secondary | ICD-10-CM | POA: Diagnosis not present

## 2018-09-13 DIAGNOSIS — G4700 Insomnia, unspecified: Secondary | ICD-10-CM | POA: Diagnosis not present

## 2018-09-13 DIAGNOSIS — F329 Major depressive disorder, single episode, unspecified: Secondary | ICD-10-CM | POA: Diagnosis not present

## 2018-09-13 DIAGNOSIS — Z9049 Acquired absence of other specified parts of digestive tract: Secondary | ICD-10-CM | POA: Diagnosis not present

## 2018-09-13 DIAGNOSIS — I10 Essential (primary) hypertension: Secondary | ICD-10-CM | POA: Diagnosis not present

## 2018-09-13 DIAGNOSIS — D649 Anemia, unspecified: Secondary | ICD-10-CM | POA: Diagnosis not present

## 2018-09-13 DIAGNOSIS — J984 Other disorders of lung: Secondary | ICD-10-CM | POA: Diagnosis not present

## 2018-09-13 DIAGNOSIS — Z7982 Long term (current) use of aspirin: Secondary | ICD-10-CM | POA: Diagnosis not present

## 2018-09-13 DIAGNOSIS — I776 Arteritis, unspecified: Secondary | ICD-10-CM | POA: Diagnosis not present

## 2018-09-13 DIAGNOSIS — Z96651 Presence of right artificial knee joint: Secondary | ICD-10-CM | POA: Diagnosis not present

## 2018-09-13 DIAGNOSIS — Z79899 Other long term (current) drug therapy: Secondary | ICD-10-CM | POA: Diagnosis not present

## 2018-09-13 DIAGNOSIS — N400 Enlarged prostate without lower urinary tract symptoms: Secondary | ICD-10-CM | POA: Diagnosis not present

## 2018-09-18 ENCOUNTER — Encounter: Payer: Self-pay | Admitting: *Deleted

## 2018-09-18 ENCOUNTER — Other Ambulatory Visit (HOSPITAL_COMMUNITY): Payer: Self-pay | Admitting: Oncology

## 2018-09-18 DIAGNOSIS — D3A00 Benign carcinoid tumor of unspecified site: Secondary | ICD-10-CM | POA: Diagnosis not present

## 2018-09-18 DIAGNOSIS — R779 Abnormality of plasma protein, unspecified: Secondary | ICD-10-CM

## 2018-09-18 DIAGNOSIS — R627 Adult failure to thrive: Secondary | ICD-10-CM | POA: Diagnosis not present

## 2018-09-18 DIAGNOSIS — M316 Other giant cell arteritis: Secondary | ICD-10-CM | POA: Diagnosis not present

## 2018-09-18 DIAGNOSIS — H539 Unspecified visual disturbance: Secondary | ICD-10-CM | POA: Diagnosis not present

## 2018-09-18 DIAGNOSIS — H9209 Otalgia, unspecified ear: Secondary | ICD-10-CM | POA: Diagnosis not present

## 2018-09-18 DIAGNOSIS — I10 Essential (primary) hypertension: Secondary | ICD-10-CM | POA: Diagnosis not present

## 2018-09-18 DIAGNOSIS — I776 Arteritis, unspecified: Secondary | ICD-10-CM | POA: Diagnosis not present

## 2018-09-18 DIAGNOSIS — R490 Dysphonia: Secondary | ICD-10-CM | POA: Diagnosis not present

## 2018-09-18 DIAGNOSIS — M488X6 Other specified spondylopathies, lumbar region: Secondary | ICD-10-CM | POA: Diagnosis not present

## 2018-09-19 ENCOUNTER — Ambulatory Visit: Payer: Medicare Other | Admitting: Cardiology

## 2018-09-19 ENCOUNTER — Other Ambulatory Visit: Payer: Self-pay

## 2018-09-19 ENCOUNTER — Telehealth: Payer: Self-pay | Admitting: Cardiology

## 2018-09-19 ENCOUNTER — Encounter

## 2018-09-19 ENCOUNTER — Encounter: Payer: Self-pay | Admitting: Cardiology

## 2018-09-19 VITALS — BP 171/72 | HR 64 | Ht 73.0 in | Wt 182.6 lb

## 2018-09-19 DIAGNOSIS — R0789 Other chest pain: Secondary | ICD-10-CM

## 2018-09-19 DIAGNOSIS — I2510 Atherosclerotic heart disease of native coronary artery without angina pectoris: Secondary | ICD-10-CM

## 2018-09-19 DIAGNOSIS — I10 Essential (primary) hypertension: Secondary | ICD-10-CM

## 2018-09-19 NOTE — Patient Instructions (Signed)

## 2018-09-19 NOTE — Telephone Encounter (Signed)

## 2018-09-19 NOTE — Progress Notes (Signed)
Clinical Summary Travis Page is a 78 y.o.male seen today for follow up of the following medical problems.  1. Chest pain - 10/2016 nuclear stress: moderate inferior ischemia, LVEF 54%, intermediate risk - 11/2016 cath as reported below, essentially mild main vessel disease and more significant small vessel disease not amenable to revascularization. - no chest pain. No SOB/DOE - compliant with meds. He has statin allergy   - ER visit 06/2018 to [facility] with chest pain. ER notes describes as constant x 2 days pain that was reproducible. D-dimer was elevated, CT PE was negative. Trop neg x 2, EKG sinus tach with occasional PVCs - treated with NSAIDs with improvement   07/2018 echo LVEF 56-38%, grade I diastolic dysfunction    2. HTN -compliant with meds - in hospital bps 110-130s/70s  3. Hyperlipidemia - he does not recall history of statin intolerancethough listed in his chart, appears this was initially managed by his pcp - management has been deferred to his pcp   4. Esophageal candidiasis - admit 08/2018 with N/V, fever, weight loss.  - being evaluated for occult malignancy.   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 Fatigue   . High cholesterol   . Hypertension   . Shortness of breath      Allergies  Allergen Reactions  . Statins Other (See Comments)    Muscle aches.     Current Outpatient Medications  Medication Sig Dispense Refill  . aspirin EC 81 MG tablet Take 1 tablet (81 mg total) by mouth at bedtime.    . Cholecalciferol (VITAMIN D3 PO) Take 1 tablet by mouth daily.    . cyanocobalamin (,VITAMIN B-12,) 1000 MCG/ML injection Inject 1,000 mcg into the muscle every 30 (thirty) days.    Marland Kitchen dutasteride (AVODART) 0.5 MG capsule Take 0.5 mg by mouth daily at 3 pm.     . fenofibrate 160 MG tablet Take 160 mg by mouth daily.    . fluconazole (DIFLUCAN) 200  MG tablet Take 1 tablet (200 mg total) by mouth daily. 8 tablet 0  . HYDROcodone-acetaminophen (NORCO) 10-325 MG tablet Take 1 tablet by mouth every 6 (six) hours as needed (for pain).     Marland Kitchen losartan (COZAAR) 50 MG tablet Take 50 mg by mouth daily at 3 pm.     . pantoprazole (PROTONIX) 40 MG tablet Take 1 tablet (40 mg total) by mouth daily before breakfast. 30 tablet 5  . zolpidem (AMBIEN) 10 MG tablet Take 10 mg by mouth at bedtime.     No current facility-administered medications for this visit.      Past Surgical History:  Procedure Laterality Date  . CHOLECYSTECTOMY    . COLONOSCOPY N/A 01/22/2018   Procedure: COLONOSCOPY;  Surgeon: Rogene Houston, MD;  [facility];  Service: Endoscopy;  Laterality: N/A;  . ESOPHAGEAL DILATION N/A 12/10/2015   Procedure: ESOPHAGEAL DILATION;  Surgeon: Rogene Houston, MD;  [facility];  Service: Endoscopy;  Laterality: N/A;  . ESOPHAGEAL DILATION N/A 01/22/2018   Procedure: ESOPHAGEAL DILATION;  Surgeon: Rogene Houston, MD;  [facility];  Service: Endoscopy;  Laterality: N/A;  . ESOPHAGOGASTRODUODENOSCOPY N/A 12/10/2015   Procedure: ESOPHAGOGASTRODUODENOSCOPY (EGD);  Surgeon: Rogene Houston, MD;  [facility];  Service: Endoscopy;  Laterality: N/A;  . ESOPHAGOGASTRODUODENOSCOPY N/A 01/22/2018   Procedure: ESOPHAGOGASTRODUODENOSCOPY (EGD);  Surgeon: Rogene Houston, MD;  [facility];  Service: Endoscopy;  Laterality: N/A;  12:00-rescheduled 11/4 @ 8:15am per Lelon Frohlich  . LEFT HEART CATH AND CORONARY ANGIOGRAPHY N/A 12/07/2016   Procedure: LEFT HEART CATH AND CORONARY ANGIOGRAPHY;  Surgeon: Jettie Booze, MD;  Location: [place] [facility];  Service: Cardiovascular;  Laterality: N/A;  . POLYPECTOMY  01/22/2018   Procedure: POLYPECTOMY;  Surgeon: Rogene Houston, MD;  [facility];  Service: Endoscopy;;  colon  . REPLACEMENT TOTAL KNEE     rt knee      Allergies  Allergen  Reactions  . Statins Other (See Comments)    Muscle aches.      Family History  Problem Relation Age of Onset  . Heart attack Mother   . Hypercholesterolemia Mother   . Heart attack Father   . Hypercholesterolemia Sister   . CAD Sister      Social History Travis Page reports that he has never smoked. His smokeless tobacco use includes chew. Travis Page reports no history of alcohol use.   Review of Systems CONSTITUTIONAL: No weight loss, fever, chills, weakness or fatigue.  HEENT: Eyes: No visual loss, blurred vision, double vision or yellow sclerae.No hearing loss, sneezing, congestion, runny nose or sore throat.  SKIN: No rash or itching.  CARDIOVASCULAR: per hpi RESPIRATORY: No shortness of breath, cough or sputum.  GASTROINTESTINAL: No anorexia, nausea, vomiting or diarrhea. No abdominal pain or blood.  GENITOURINARY: No burning on urination, no polyuria NEUROLOGICAL: No headache, dizziness, syncope, paralysis, ataxia, numbness or tingling in the extremities. No change in bowel or bladder control.  MUSCULOSKELETAL: No muscle, back pain, joint pain or stiffness.  LYMPHATICS: No enlarged nodes. No history of splenectomy.  PSYCHIATRIC: No history of depression or anxiety.  ENDOCRINOLOGIC: No reports of sweating, cold or heat intolerance. No polyuria or polydipsia.  Marland Kitchen   Physical Examination Today's Vitals   09/19/18 0918  BP: (!) 171/72  Pulse: 64  SpO2: 97%  Weight: 182 lb 9.6 oz (82.8 kg)  Height: 6\' 1"  (1.854 m)   Body mass index is 24.09 kg/m.  Gen: resting comfortably, no acute distress HEENT: no scleral icterus, pupils equal round and reactive, no palptable cervical adenopathy,  CV: RRR, no m/r/g, no jvd Resp: Clear to auscultation bilaterally GI: abdomen is soft, non-tender, non-distended, normal bowel sounds, no hepatosplenomegaly MSK: extremities are warm, no edema.  Skin: warm, no rash Neuro:  no focal deficits Psych: appropriate affect    Diagnostic Studies  11/2016 cath  Mid LAD-2 lesion, 25 %stenosed.  Mid LAD-1 lesion, 25 %stenosed.  Ost 2nd Diag to 2nd Diag lesion, 25 %stenosed.  Ramus lesion, 25 %stenosed.  Dist Cx lesion, 70 %stenosed.  Ost LM to LM lesion, 20 %stenosed.  Ost RCA lesion, 25 %stenosed.  Prox RCA lesion, 30 %stenosed.  Dist RCA lesion, 30 %stenosed.  RPDA lesion, 80 %stenosed.  The left ventricular systolic function is normal.  LV end diastolic pressure is normal.  The left ventricular ejection fraction is 50-55% by visual estimate.  There is no aortic valve stenosis.  Mild disease in the main vessels. He does have significant small vessel disease including distal circumflex. This vessel would be too small to intervene upon. 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 The posterior lateral artery is a larger Tyishia Aune and larger territory supplied, and intervention of the PDA with compromise posterior lateral artery. Would recommend medical therapy for potential anginal symptoms.  Continue  aggressive risk factor modification including lipid-lowering therapy.   10/2016 nuclear stress: moderate inferior ischemia, LVEF 54%, intermediate risk  07/2018 echo  1. The left ventricle has normal systolic function with an ejection fraction of 60-65%. The cavity size was normal. Left ventricular diastolic Doppler parameters are consistent with impaired relaxation.  2. The right ventricle has normal systolic function. The cavity was normal. There is no increase in right ventricular wall thickness.  3. The aortic root is normal in size and structure.   Assessment and Plan   1. Chest pain/CAD -cath with small vessel disease not amenable to revasc - recent chest wall pain, not cardiac. Continue current medical therapy, he has statin allergy   2.HTN - above goal in clinic, reasonable control during recent admission. He is on prednisone - contineu to monitor home bp's, room to  titrate losartan if needed   3. Hyperlipidemia - deferred to pcp who has the history regarding his statin intolerance.      Arnoldo Lenis, M.D.

## 2018-09-20 DIAGNOSIS — D3A00 Benign carcinoid tumor of unspecified site: Secondary | ICD-10-CM | POA: Diagnosis not present

## 2018-09-20 DIAGNOSIS — Z95828 Presence of other vascular implants and grafts: Secondary | ICD-10-CM | POA: Diagnosis not present

## 2018-09-20 DIAGNOSIS — M488X6 Other specified spondylopathies, lumbar region: Secondary | ICD-10-CM | POA: Diagnosis not present

## 2018-09-20 DIAGNOSIS — N179 Acute kidney failure, unspecified: Secondary | ICD-10-CM | POA: Diagnosis not present

## 2018-09-20 DIAGNOSIS — I776 Arteritis, unspecified: Secondary | ICD-10-CM | POA: Diagnosis not present

## 2018-09-25 ENCOUNTER — Other Ambulatory Visit: Payer: Self-pay

## 2018-09-25 ENCOUNTER — Ambulatory Visit (HOSPITAL_COMMUNITY)
Admission: RE | Admit: 2018-09-25 | Discharge: 2018-09-25 | Disposition: A | Discharge status: Home / Self Care | Payer: Medicare Other | Source: Ambulatory Visit | Attending: Oncology | Admitting: Oncology

## 2018-09-25 DIAGNOSIS — D3A00 Benign carcinoid tumor of unspecified site: Secondary | ICD-10-CM | POA: Insufficient documentation

## 2018-09-25 DIAGNOSIS — R779 Abnormality of plasma protein, unspecified: Secondary | ICD-10-CM | POA: Insufficient documentation

## 2018-09-25 DIAGNOSIS — C7A00 Malignant carcinoid tumor of unspecified site: Secondary | ICD-10-CM | POA: Diagnosis not present

## 2018-09-25 MED ORDER — GALLIUM GA 68 DOTATATE IV KIT
4.4000 | PACK | Freq: Once | INTRAVENOUS | Status: AC | PRN
  Administered 2018-09-25: 4.4 via INTRAVENOUS

## 2018-10-02 DIAGNOSIS — Z7952 Long term (current) use of systemic steroids: Secondary | ICD-10-CM | POA: Diagnosis not present

## 2018-10-02 DIAGNOSIS — Z5181 Encounter for therapeutic drug level monitoring: Secondary | ICD-10-CM | POA: Diagnosis not present

## 2018-10-02 DIAGNOSIS — M316 Other giant cell arteritis: Secondary | ICD-10-CM | POA: Diagnosis not present

## 2018-10-02 DIAGNOSIS — I776 Arteritis, unspecified: Secondary | ICD-10-CM | POA: Diagnosis not present

## 2018-10-02 DIAGNOSIS — Z79899 Other long term (current) drug therapy: Secondary | ICD-10-CM | POA: Diagnosis not present

## 2018-10-02 DIAGNOSIS — D3A00 Benign carcinoid tumor of unspecified site: Secondary | ICD-10-CM | POA: Diagnosis not present

## 2018-10-02 DIAGNOSIS — I10 Essential (primary) hypertension: Secondary | ICD-10-CM | POA: Diagnosis not present

## 2018-10-03 DIAGNOSIS — H15012 Anterior scleritis, left eye: Secondary | ICD-10-CM | POA: Diagnosis not present

## 2018-10-04 DIAGNOSIS — N179 Acute kidney failure, unspecified: Secondary | ICD-10-CM | POA: Diagnosis not present

## 2018-10-04 DIAGNOSIS — M488X6 Other specified spondylopathies, lumbar region: Secondary | ICD-10-CM | POA: Diagnosis not present

## 2018-10-04 DIAGNOSIS — I776 Arteritis, unspecified: Secondary | ICD-10-CM | POA: Diagnosis not present

## 2018-10-24 DIAGNOSIS — Z5181 Encounter for therapeutic drug level monitoring: Secondary | ICD-10-CM | POA: Diagnosis not present

## 2018-10-24 DIAGNOSIS — Z79899 Other long term (current) drug therapy: Secondary | ICD-10-CM | POA: Diagnosis not present

## 2018-10-24 DIAGNOSIS — M316 Other giant cell arteritis: Secondary | ICD-10-CM | POA: Diagnosis not present

## 2018-10-24 DIAGNOSIS — Z7952 Long term (current) use of systemic steroids: Secondary | ICD-10-CM | POA: Diagnosis not present

## 2018-10-24 DIAGNOSIS — I776 Arteritis, unspecified: Secondary | ICD-10-CM | POA: Diagnosis not present

## 2018-11-01 DIAGNOSIS — Z95828 Presence of other vascular implants and grafts: Secondary | ICD-10-CM | POA: Diagnosis not present

## 2018-11-01 DIAGNOSIS — N179 Acute kidney failure, unspecified: Secondary | ICD-10-CM | POA: Diagnosis not present

## 2018-11-01 DIAGNOSIS — M488X6 Other specified spondylopathies, lumbar region: Secondary | ICD-10-CM | POA: Diagnosis not present

## 2018-11-01 DIAGNOSIS — Z5111 Encounter for antineoplastic chemotherapy: Secondary | ICD-10-CM | POA: Diagnosis not present

## 2018-11-01 DIAGNOSIS — I776 Arteritis, unspecified: Secondary | ICD-10-CM | POA: Diagnosis not present

## 2018-11-14 DIAGNOSIS — Z86010 Personal history of colonic polyps: Secondary | ICD-10-CM | POA: Diagnosis not present

## 2018-11-14 DIAGNOSIS — I10 Essential (primary) hypertension: Secondary | ICD-10-CM | POA: Diagnosis not present

## 2018-11-14 DIAGNOSIS — R627 Adult failure to thrive: Secondary | ICD-10-CM | POA: Diagnosis not present

## 2018-11-14 DIAGNOSIS — M316 Other giant cell arteritis: Secondary | ICD-10-CM | POA: Diagnosis not present

## 2018-11-14 DIAGNOSIS — R490 Dysphonia: Secondary | ICD-10-CM | POA: Diagnosis not present

## 2018-11-14 DIAGNOSIS — J343 Hypertrophy of nasal turbinates: Secondary | ICD-10-CM | POA: Diagnosis not present

## 2018-11-14 DIAGNOSIS — Z5181 Encounter for therapeutic drug level monitoring: Secondary | ICD-10-CM | POA: Diagnosis not present

## 2018-11-14 DIAGNOSIS — J342 Deviated nasal septum: Secondary | ICD-10-CM | POA: Diagnosis not present

## 2018-11-14 DIAGNOSIS — Z79899 Other long term (current) drug therapy: Secondary | ICD-10-CM | POA: Diagnosis not present

## 2018-11-14 DIAGNOSIS — Z789 Other specified health status: Secondary | ICD-10-CM | POA: Diagnosis not present

## 2018-11-14 DIAGNOSIS — R1310 Dysphagia, unspecified: Secondary | ICD-10-CM | POA: Diagnosis not present

## 2018-11-14 DIAGNOSIS — J0141 Acute recurrent pansinusitis: Secondary | ICD-10-CM | POA: Diagnosis not present

## 2018-11-14 DIAGNOSIS — M488X6 Other specified spondylopathies, lumbar region: Secondary | ICD-10-CM | POA: Diagnosis not present

## 2018-11-14 DIAGNOSIS — I776 Arteritis, unspecified: Secondary | ICD-10-CM | POA: Diagnosis not present

## 2018-11-14 DIAGNOSIS — N179 Acute kidney failure, unspecified: Secondary | ICD-10-CM | POA: Diagnosis not present

## 2018-11-14 DIAGNOSIS — D3A00 Benign carcinoid tumor of unspecified site: Secondary | ICD-10-CM | POA: Diagnosis not present

## 2018-11-15 DIAGNOSIS — H15012 Anterior scleritis, left eye: Secondary | ICD-10-CM | POA: Diagnosis not present

## 2018-11-15 DIAGNOSIS — Z961 Presence of intraocular lens: Secondary | ICD-10-CM | POA: Diagnosis not present

## 2018-11-19 DIAGNOSIS — E782 Mixed hyperlipidemia: Secondary | ICD-10-CM | POA: Diagnosis not present

## 2018-11-19 DIAGNOSIS — I10 Essential (primary) hypertension: Secondary | ICD-10-CM | POA: Diagnosis not present

## 2018-11-30 DIAGNOSIS — R7301 Impaired fasting glucose: Secondary | ICD-10-CM | POA: Diagnosis not present

## 2018-11-30 DIAGNOSIS — K219 Gastro-esophageal reflux disease without esophagitis: Secondary | ICD-10-CM | POA: Diagnosis not present

## 2018-11-30 DIAGNOSIS — N183 Chronic kidney disease, stage 3 (moderate): Secondary | ICD-10-CM | POA: Diagnosis not present

## 2018-12-05 DIAGNOSIS — I776 Arteritis, unspecified: Secondary | ICD-10-CM | POA: Diagnosis not present

## 2018-12-05 DIAGNOSIS — H9190 Unspecified hearing loss, unspecified ear: Secondary | ICD-10-CM | POA: Diagnosis not present

## 2018-12-05 DIAGNOSIS — R0600 Dyspnea, unspecified: Secondary | ICD-10-CM | POA: Diagnosis not present

## 2018-12-05 DIAGNOSIS — A239 Brucellosis, unspecified: Secondary | ICD-10-CM | POA: Diagnosis not present

## 2018-12-19 DIAGNOSIS — Z79899 Other long term (current) drug therapy: Secondary | ICD-10-CM | POA: Diagnosis not present

## 2018-12-19 DIAGNOSIS — M316 Other giant cell arteritis: Secondary | ICD-10-CM | POA: Diagnosis not present

## 2018-12-19 DIAGNOSIS — I776 Arteritis, unspecified: Secondary | ICD-10-CM | POA: Diagnosis not present

## 2018-12-19 DIAGNOSIS — I10 Essential (primary) hypertension: Secondary | ICD-10-CM | POA: Diagnosis not present

## 2018-12-19 DIAGNOSIS — H04123 Dry eye syndrome of bilateral lacrimal glands: Secondary | ICD-10-CM | POA: Diagnosis not present

## 2018-12-19 DIAGNOSIS — J0141 Acute recurrent pansinusitis: Secondary | ICD-10-CM | POA: Diagnosis not present

## 2018-12-19 DIAGNOSIS — D3A00 Benign carcinoid tumor of unspecified site: Secondary | ICD-10-CM | POA: Diagnosis not present

## 2018-12-19 DIAGNOSIS — Z5181 Encounter for therapeutic drug level monitoring: Secondary | ICD-10-CM | POA: Diagnosis not present

## 2018-12-19 DIAGNOSIS — E782 Mixed hyperlipidemia: Secondary | ICD-10-CM | POA: Diagnosis not present

## 2018-12-27 DIAGNOSIS — N179 Acute kidney failure, unspecified: Secondary | ICD-10-CM | POA: Diagnosis not present

## 2018-12-27 DIAGNOSIS — M488X6 Other specified spondylopathies, lumbar region: Secondary | ICD-10-CM | POA: Diagnosis not present

## 2018-12-27 DIAGNOSIS — I776 Arteritis, unspecified: Secondary | ICD-10-CM | POA: Diagnosis not present

## 2018-12-27 DIAGNOSIS — Z95828 Presence of other vascular implants and grafts: Secondary | ICD-10-CM | POA: Diagnosis not present

## 2019-01-10 DIAGNOSIS — H15012 Anterior scleritis, left eye: Secondary | ICD-10-CM | POA: Diagnosis not present

## 2019-01-14 DIAGNOSIS — D3A00 Benign carcinoid tumor of unspecified site: Secondary | ICD-10-CM | POA: Diagnosis not present

## 2019-01-14 DIAGNOSIS — I10 Essential (primary) hypertension: Secondary | ICD-10-CM | POA: Diagnosis not present

## 2019-01-14 DIAGNOSIS — N179 Acute kidney failure, unspecified: Secondary | ICD-10-CM | POA: Diagnosis not present

## 2019-01-14 DIAGNOSIS — I776 Arteritis, unspecified: Secondary | ICD-10-CM | POA: Diagnosis not present

## 2019-01-14 DIAGNOSIS — M316 Other giant cell arteritis: Secondary | ICD-10-CM | POA: Diagnosis not present

## 2019-01-16 DIAGNOSIS — I776 Arteritis, unspecified: Secondary | ICD-10-CM | POA: Diagnosis not present

## 2019-02-11 DIAGNOSIS — D3A00 Benign carcinoid tumor of unspecified site: Secondary | ICD-10-CM | POA: Diagnosis not present

## 2019-02-11 DIAGNOSIS — I776 Arteritis, unspecified: Secondary | ICD-10-CM | POA: Diagnosis not present

## 2019-02-11 DIAGNOSIS — I10 Essential (primary) hypertension: Secondary | ICD-10-CM | POA: Diagnosis not present

## 2019-02-11 DIAGNOSIS — M488X6 Other specified spondylopathies, lumbar region: Secondary | ICD-10-CM | POA: Diagnosis not present

## 2019-02-11 DIAGNOSIS — M316 Other giant cell arteritis: Secondary | ICD-10-CM | POA: Diagnosis not present

## 2019-02-18 DIAGNOSIS — Z79899 Other long term (current) drug therapy: Secondary | ICD-10-CM | POA: Diagnosis not present

## 2019-02-18 DIAGNOSIS — Z5181 Encounter for therapeutic drug level monitoring: Secondary | ICD-10-CM | POA: Diagnosis not present

## 2019-02-18 DIAGNOSIS — I776 Arteritis, unspecified: Secondary | ICD-10-CM | POA: Diagnosis not present

## 2019-02-18 DIAGNOSIS — R0600 Dyspnea, unspecified: Secondary | ICD-10-CM | POA: Diagnosis not present

## 2019-02-18 DIAGNOSIS — I110 Hypertensive heart disease with heart failure: Secondary | ICD-10-CM | POA: Diagnosis not present

## 2019-02-20 DIAGNOSIS — L570 Actinic keratosis: Secondary | ICD-10-CM | POA: Diagnosis not present

## 2019-02-21 DIAGNOSIS — Z95828 Presence of other vascular implants and grafts: Secondary | ICD-10-CM | POA: Diagnosis not present

## 2019-02-21 DIAGNOSIS — N179 Acute kidney failure, unspecified: Secondary | ICD-10-CM | POA: Diagnosis not present

## 2019-02-21 DIAGNOSIS — M488X6 Other specified spondylopathies, lumbar region: Secondary | ICD-10-CM | POA: Diagnosis not present

## 2019-02-21 DIAGNOSIS — I776 Arteritis, unspecified: Secondary | ICD-10-CM | POA: Diagnosis not present

## 2019-02-26 DIAGNOSIS — I776 Arteritis, unspecified: Secondary | ICD-10-CM | POA: Diagnosis not present

## 2019-02-27 DIAGNOSIS — D3502 Benign neoplasm of left adrenal gland: Secondary | ICD-10-CM | POA: Diagnosis not present

## 2019-02-27 DIAGNOSIS — I776 Arteritis, unspecified: Secondary | ICD-10-CM | POA: Diagnosis not present

## 2019-02-27 DIAGNOSIS — R0602 Shortness of breath: Secondary | ICD-10-CM | POA: Diagnosis not present

## 2019-02-27 DIAGNOSIS — J479 Bronchiectasis, uncomplicated: Secondary | ICD-10-CM | POA: Diagnosis not present

## 2019-03-04 ENCOUNTER — Encounter: Payer: Self-pay | Admitting: Cardiology

## 2019-03-04 ENCOUNTER — Other Ambulatory Visit: Payer: Self-pay

## 2019-03-04 ENCOUNTER — Ambulatory Visit (INDEPENDENT_AMBULATORY_CARE_PROVIDER_SITE_OTHER): Payer: Medicare Other | Admitting: Family Medicine

## 2019-03-04 VITALS — BP 145/79 | HR 69 | Ht 72.0 in | Wt 204.6 lb

## 2019-03-04 DIAGNOSIS — I10 Essential (primary) hypertension: Secondary | ICD-10-CM | POA: Diagnosis not present

## 2019-03-04 DIAGNOSIS — E782 Mixed hyperlipidemia: Secondary | ICD-10-CM | POA: Diagnosis not present

## 2019-03-04 DIAGNOSIS — I2510 Atherosclerotic heart disease of native coronary artery without angina pectoris: Secondary | ICD-10-CM | POA: Diagnosis not present

## 2019-03-04 NOTE — Patient Instructions (Signed)

## 2019-03-04 NOTE — Progress Notes (Signed)
Cardiology Office Note  Date: 03/04/2019   ID: Travis Page, Travis Page 1940/10/18, MRN [id number]  PCP:  Curlene Labrum, MD  Cardiologist:  Carlyle Dolly, MD Electrophysiologist:  None   Chief Complaint  Patient presents with  . Follow-up    Your chest pain, hyperlipidemia, hypertension, vasculitis,    History of Present Illness: Travis Page is a 78 y.o. male last seen in July 2020 for follow-up secondary to chest pain, hypertension, hyperlipidemia, ANCA vasculitis, esophageal candidiasis.  Patient was admitted to [facility] on May 27 for complaints of generalized weakness, weight loss of 20 pounds, anorexia, fever, hearing loss, fatigue.  Patient was noted to have esophageal candidiasis.  Concern at that time was for malignancy but patient had multiple CT scans of the head, face, neck, chest, abdomen and pelvis.  There were no abnormalities.  Patient had an echocardiogram secondary to a fever of 101which was negative for any vegetations or evidence of pericarditis.  Discharge note suggested  possible hematology oncology follow-up, rheumatology follow-up, dermatology follow-up given remote history of melanoma.  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  Has been eating well but sleeping poorly.  He has gained approximately 3 pounds recently.  States he has been feeling better since receiving infliximab infusions.  He denies any further chest pain or progressive anginal symptoms.  States he has occasional shortness of breath which is short-lived.  States recently had one episode of shortness of breath but after receiving infusion of infliximab breathing improved.   Past Medical History:  Diagnosis Date  . Fatigue   . High cholesterol   . Hypertension   . Shortness of breath     Past Surgical History:  Procedure Laterality Date  . CHOLECYSTECTOMY    . COLONOSCOPY N/A  01/22/2018   Procedure: COLONOSCOPY;  Surgeon: Rogene Houston, MD;  [facility];  Service: Endoscopy;  Laterality: N/A;  . ESOPHAGEAL DILATION N/A 12/10/2015   Procedure: ESOPHAGEAL DILATION;  Surgeon: Rogene Houston, MD;  [facility];  Service: Endoscopy;  Laterality: N/A;  . ESOPHAGEAL DILATION N/A 01/22/2018   Procedure: ESOPHAGEAL DILATION;  Surgeon: Rogene Houston, MD;  [facility];  Service: Endoscopy;  Laterality: N/A;  . ESOPHAGOGASTRODUODENOSCOPY N/A 12/10/2015   Procedure: ESOPHAGOGASTRODUODENOSCOPY (EGD);  Surgeon: Rogene Houston, MD;  [facility];  Service: Endoscopy;  Laterality: N/A;  . ESOPHAGOGASTRODUODENOSCOPY N/A 01/22/2018   Procedure: ESOPHAGOGASTRODUODENOSCOPY (EGD);  Surgeon: Rogene Houston, MD;  [facility];  Service: Endoscopy;  Laterality: N/A;  12:00-rescheduled 11/4 @ 8:15am per Lelon Frohlich  . LEFT HEART CATH AND CORONARY ANGIOGRAPHY N/A 12/07/2016   Procedure: LEFT HEART CATH AND CORONARY ANGIOGRAPHY;  Surgeon: Jettie Booze, MD;  Location: [place] [facility];  Service: Cardiovascular;  Laterality: N/A;  . POLYPECTOMY  01/22/2018   Procedure: POLYPECTOMY;  Surgeon: Rogene Houston, MD;  [facility];  Service: Endoscopy;;  colon  . REPLACEMENT TOTAL KNEE     rt knee     Current Outpatient Medications  Medication Sig Dispense Refill  . aspirin EC 81 MG tablet Take 1 tablet (81 mg total) by mouth at bedtime.    . Cholecalciferol (VITAMIN D3 PO) Take 1 tablet by mouth daily.    . cyanocobalamin (,VITAMIN B-12,) 1000 MCG/ML injection Inject 1,000 mcg into the muscle every 30 (thirty) days.    Marland Kitchen dutasteride (  AVODART) 0.5 MG capsule Take 0.5 mg by mouth daily at 3 pm.     . fenofibrate 160 MG tablet Take 160 mg by mouth daily.    Marland Kitchen FLUoxetine (PROZAC) 20 MG capsule Take 1 capsule by mouth daily.    . fluticasone (FLONASE) 50 MCG/ACT nasal spray Place 2 sprays into both nostrils daily as needed for  allergies or rhinitis.    Marland Kitchen HYDROcodone-acetaminophen (NORCO) 10-325 MG tablet Take 1 tablet by mouth every 6 (six) hours as needed (for pain).     Marland Kitchen losartan (COZAAR) 50 MG tablet Take 50 mg by mouth daily at 3 pm.     . pantoprazole (PROTONIX) 40 MG tablet Take 1 tablet (40 mg total) by mouth daily before breakfast. 30 tablet 5  . tamsulosin (FLOMAX) 0.4 MG CAPS capsule Take 1 capsule by mouth daily.    Marland Kitchen zolpidem (AMBIEN) 10 MG tablet Take 10 mg by mouth at bedtime.     No current facility-administered medications for this visit.   Allergies:  Statins   Social History: The patient  reports that he has never smoked. His smokeless tobacco use includes chew. He reports that he does not drink alcohol or use drugs.   Family History: The patient's family history includes CAD in his sister; Heart attack in his father and mother; Hypercholesterolemia in his mother and sister.   ROS:  Please see the history of present illness. Otherwise, complete review of systems is positive for none.  All other systems are reviewed and negative.   Physical Exam: VS:  BP (!) 145/79   Pulse 69   Ht 6' (1.829 m)   Wt 204 lb 9.6 oz (92.8 kg)   SpO2 98%   BMI 27.75 kg/m , BMI Body mass index is 27.75 kg/m.  Wt Readings from Last 3 Encounters:  03/04/19 204 lb 9.6 oz (92.8 kg)  09/19/18 182 lb 9.6 oz (82.8 kg)  08/20/18 170 lb 3.2 oz (77.2 kg)    General: Patient appears comfortable at rest. HEENT: Conjunctiva and lids normal, oropharynx clear with moist mucosa. Neck: Supple, no elevated JVP or carotid bruits, no thyromegaly. Lungs: Clear to auscultation, nonlabored breathing at rest. Cardiac: Regular rate and rhythm, no S3 or significant systolic murmur, no pericardial rub. Abdomen: Soft, nontender, no hepatomegaly, bowel sounds present, no guarding or rebound. Extremities: No pitting edema, distal pulses 2+. Skin: Warm and dry. Musculoskeletal: No kyphosis. Neuropsychiatric: Alert and oriented x3,  affect grossly appropriate.  ECG:  An ECG dated Aug 16, 2018 was personally reviewed today and demonstrated:  Sinus tachycardia 108 with premature ventricular complexes.  Recent Labwork: 08/15/2018: B Natriuretic Peptide 46.2; TSH 2.941 08/19/2018: ALT 11; AST 11; BUN 11; Creatinine, Ser 1.02; Hemoglobin 9.4; Magnesium 1.8; Platelets 574; Potassium 4.2; Sodium 135   Recent lab work at [facility] on February 26, 2019 showed glucose of 115, creatinine 1.33, GFR 50, sodium 138, potassium 4.7, calcium 10.4, AST 12, ALT 10, hemoglobin 13.7, hematocrit 40.8, platelets 397    Component Value Date/Time   CHOL 118 08/16/2018 0048   TRIG 174 (H) 08/16/2018 0048   HDL 23 (L) 08/16/2018 0048   CHOLHDL 5.1 08/16/2018 0048   VLDL 35 08/16/2018 0048   LDLCALC 60 08/16/2018 0048    Other Studies Reviewed Today: 11/2016 cath  Mid LAD-2 lesion, 25 %stenosed.  Mid LAD-1 lesion, 25 %stenosed.  Ost 2nd Diag to 2nd Diag lesion, 25 %stenosed.  Ramus lesion, 25 %stenosed.  Dist Cx lesion, 70 %stenosed.  Ost LM to LM lesion, 20 %stenosed.  Ost RCA lesion, 25 %stenosed.  Prox RCA lesion, 30 %stenosed.  Dist RCA lesion, 30 %stenosed.  RPDA lesion, 80 %stenosed.  The left ventricular systolic function is normal.  LV end diastolic pressure is normal.  The left ventricular ejection fraction is 50-55% by visual estimate.  There is no aortic valve stenosis.  Mild disease in the main vessels. He does have significant small vessel disease including distal circumflex. This vessel would be too small to intervene upon. 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 The posterior lateral artery is a larger branch and larger territory supplied, and intervention of the PDA with compromise posterior lateral artery. Would recommend medical therapy for potential anginal symptoms.  Continue aggressive risk factor modification including lipid-lowering therapy.   10/2016 nuclear stress:  moderate inferior ischemia, LVEF 54%, intermediate risk  07/2018 echo 1. The left ventricle has normal systolic function with an ejection fraction of 60-65%. The cavity size was normal. Left ventricular diastolic Doppler parameters are consistent with impaired relaxation. 2. The right ventricle has normal systolic function. The cavity was normal. There is no increase in right ventricular wall thickness. 3. The aortic root is normal in size and structure.  Assessment and Plan:  1. Coronary artery disease involving native coronary artery of native heart without angina pectoris   2. Essential hypertension   3. Mixed hyperlipidemia    1. Coronary artery disease involving native coronary artery of native heart without angina pectoris Denies any further episodes of chest pain since beginning infliximab infusions for ANCA vasculitis and suspicion of a carcinoid tumor.  Recent echo in May Doppler parameters consistent with impaired relaxation.  EF was 60 to 65%.  Continue aspirin 81 mg, fenofibrate 160 mg.  2. Essential hypertension Systolic blood pressure slightly elevated today at 140.  Patient states normally blood pressure is in 120s or 130s at home.  Continue losartan 50 mg daily.  3. Mixed hyperlipidemia On fenofibrate 160 mg daily.  Lipid panel in May show total cholesterol 118, triglycerides 174, HDL 23, LDL 60.  Continue fenofibrate.  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  He receives infliximab infusions and periodic steroids for treatment.  States he is feeling much better since starting the infliximab infusions.  He states vasculitis affected his eyesight, breathing, hearing.   Medication Adjustments/Labs and Tests Ordered: Current medicines are reviewed at length with the patient today.  Concerns regarding medicines are outlined above.    Patient Instructions  Your physician wants you to follow-up in: [place] will  receive a reminder letter in the mail two months in advance. If you don't receive a letter, please call our office to schedule the follow-up appointment.  Your physician recommends that you continue on your current medications as directed. Please refer to the Current Medication list given to you today.  Thank you for choosing [facility]!!           Signed, Levell July, NP 03/04/2019 4:08 PM    [facility] [place], [place], [place] [address] Phone: [phone]; Fax: [phone]

## 2019-03-08 DIAGNOSIS — I10 Essential (primary) hypertension: Secondary | ICD-10-CM | POA: Diagnosis not present

## 2019-03-08 DIAGNOSIS — E782 Mixed hyperlipidemia: Secondary | ICD-10-CM | POA: Diagnosis not present

## 2019-03-08 DIAGNOSIS — K219 Gastro-esophageal reflux disease without esophagitis: Secondary | ICD-10-CM | POA: Diagnosis not present

## 2019-03-08 DIAGNOSIS — R7301 Impaired fasting glucose: Secondary | ICD-10-CM | POA: Diagnosis not present

## 2019-03-18 DIAGNOSIS — Z5181 Encounter for therapeutic drug level monitoring: Secondary | ICD-10-CM | POA: Diagnosis not present

## 2019-03-18 DIAGNOSIS — Z79899 Other long term (current) drug therapy: Secondary | ICD-10-CM | POA: Diagnosis not present

## 2019-03-18 DIAGNOSIS — I776 Arteritis, unspecified: Secondary | ICD-10-CM | POA: Diagnosis not present

## 2019-03-18 DIAGNOSIS — N2889 Other specified disorders of kidney and ureter: Secondary | ICD-10-CM | POA: Diagnosis not present

## 2019-03-20 DIAGNOSIS — K760 Fatty (change of) liver, not elsewhere classified: Secondary | ICD-10-CM | POA: Diagnosis not present

## 2019-03-20 DIAGNOSIS — N281 Cyst of kidney, acquired: Secondary | ICD-10-CM | POA: Diagnosis not present

## 2019-03-20 DIAGNOSIS — N2889 Other specified disorders of kidney and ureter: Secondary | ICD-10-CM | POA: Diagnosis not present

## 2019-03-21 DIAGNOSIS — F322 Major depressive disorder, single episode, severe without psychotic features: Secondary | ICD-10-CM | POA: Diagnosis not present

## 2019-03-21 DIAGNOSIS — K219 Gastro-esophageal reflux disease without esophagitis: Secondary | ICD-10-CM | POA: Diagnosis not present

## 2019-03-27 DIAGNOSIS — K222 Esophageal obstruction: Secondary | ICD-10-CM | POA: Diagnosis not present

## 2019-03-27 DIAGNOSIS — R634 Abnormal weight loss: Secondary | ICD-10-CM | POA: Diagnosis not present

## 2019-03-27 DIAGNOSIS — I776 Arteritis, unspecified: Secondary | ICD-10-CM | POA: Diagnosis not present

## 2019-03-27 DIAGNOSIS — M47812 Spondylosis without myelopathy or radiculopathy, cervical region: Secondary | ICD-10-CM | POA: Diagnosis not present

## 2019-04-22 DIAGNOSIS — M316 Other giant cell arteritis: Secondary | ICD-10-CM | POA: Diagnosis not present

## 2019-04-22 DIAGNOSIS — M488X6 Other specified spondylopathies, lumbar region: Secondary | ICD-10-CM | POA: Diagnosis not present

## 2019-04-22 DIAGNOSIS — J0141 Acute recurrent pansinusitis: Secondary | ICD-10-CM | POA: Diagnosis not present

## 2019-04-22 DIAGNOSIS — I776 Arteritis, unspecified: Secondary | ICD-10-CM | POA: Diagnosis not present

## 2019-05-01 DIAGNOSIS — R002 Palpitations: Secondary | ICD-10-CM | POA: Diagnosis not present

## 2019-05-01 DIAGNOSIS — Z79899 Other long term (current) drug therapy: Secondary | ICD-10-CM | POA: Diagnosis not present

## 2019-05-01 DIAGNOSIS — Z5181 Encounter for therapeutic drug level monitoring: Secondary | ICD-10-CM | POA: Diagnosis not present

## 2019-05-01 DIAGNOSIS — M1712 Unilateral primary osteoarthritis, left knee: Secondary | ICD-10-CM | POA: Diagnosis not present

## 2019-05-01 DIAGNOSIS — I10 Essential (primary) hypertension: Secondary | ICD-10-CM | POA: Diagnosis not present

## 2019-05-01 DIAGNOSIS — I776 Arteritis, unspecified: Secondary | ICD-10-CM | POA: Diagnosis not present

## 2019-05-01 DIAGNOSIS — N281 Cyst of kidney, acquired: Secondary | ICD-10-CM | POA: Diagnosis not present

## 2019-05-01 DIAGNOSIS — J310 Chronic rhinitis: Secondary | ICD-10-CM | POA: Diagnosis not present

## 2019-05-08 DIAGNOSIS — I6523 Occlusion and stenosis of bilateral carotid arteries: Secondary | ICD-10-CM | POA: Diagnosis not present

## 2019-05-08 DIAGNOSIS — I10 Essential (primary) hypertension: Secondary | ICD-10-CM | POA: Diagnosis not present

## 2019-05-08 DIAGNOSIS — I2510 Atherosclerotic heart disease of native coronary artery without angina pectoris: Secondary | ICD-10-CM | POA: Diagnosis not present

## 2019-05-08 DIAGNOSIS — I340 Nonrheumatic mitral (valve) insufficiency: Secondary | ICD-10-CM | POA: Diagnosis not present

## 2019-05-08 DIAGNOSIS — R002 Palpitations: Secondary | ICD-10-CM | POA: Diagnosis not present

## 2019-05-08 DIAGNOSIS — R42 Dizziness and giddiness: Secondary | ICD-10-CM | POA: Diagnosis not present

## 2019-05-14 DIAGNOSIS — Z95828 Presence of other vascular implants and grafts: Secondary | ICD-10-CM | POA: Diagnosis not present

## 2019-05-14 DIAGNOSIS — N179 Acute kidney failure, unspecified: Secondary | ICD-10-CM | POA: Diagnosis not present

## 2019-05-14 DIAGNOSIS — I776 Arteritis, unspecified: Secondary | ICD-10-CM | POA: Diagnosis not present

## 2019-05-14 DIAGNOSIS — M488X6 Other specified spondylopathies, lumbar region: Secondary | ICD-10-CM | POA: Diagnosis not present

## 2019-05-17 DIAGNOSIS — E7849 Other hyperlipidemia: Secondary | ICD-10-CM | POA: Diagnosis not present

## 2019-05-17 DIAGNOSIS — I10 Essential (primary) hypertension: Secondary | ICD-10-CM | POA: Diagnosis not present

## 2019-06-06 DIAGNOSIS — D3A00 Benign carcinoid tumor of unspecified site: Secondary | ICD-10-CM | POA: Diagnosis not present

## 2019-06-06 DIAGNOSIS — N179 Acute kidney failure, unspecified: Secondary | ICD-10-CM | POA: Diagnosis not present

## 2019-06-06 DIAGNOSIS — I776 Arteritis, unspecified: Secondary | ICD-10-CM | POA: Diagnosis not present

## 2019-06-19 DIAGNOSIS — I10 Essential (primary) hypertension: Secondary | ICD-10-CM | POA: Diagnosis not present

## 2019-06-19 DIAGNOSIS — E7849 Other hyperlipidemia: Secondary | ICD-10-CM | POA: Diagnosis not present

## 2019-07-01 DIAGNOSIS — I10 Essential (primary) hypertension: Secondary | ICD-10-CM | POA: Diagnosis not present

## 2019-07-01 DIAGNOSIS — R7301 Impaired fasting glucose: Secondary | ICD-10-CM | POA: Diagnosis not present

## 2019-07-01 DIAGNOSIS — E782 Mixed hyperlipidemia: Secondary | ICD-10-CM | POA: Diagnosis not present

## 2019-07-01 DIAGNOSIS — D519 Vitamin B12 deficiency anemia, unspecified: Secondary | ICD-10-CM | POA: Diagnosis not present

## 2019-07-01 DIAGNOSIS — E559 Vitamin D deficiency, unspecified: Secondary | ICD-10-CM | POA: Diagnosis not present

## 2019-07-04 DIAGNOSIS — Z0001 Encounter for general adult medical examination with abnormal findings: Secondary | ICD-10-CM | POA: Diagnosis not present

## 2019-07-04 DIAGNOSIS — M1712 Unilateral primary osteoarthritis, left knee: Secondary | ICD-10-CM | POA: Diagnosis not present

## 2019-07-04 DIAGNOSIS — I776 Arteritis, unspecified: Secondary | ICD-10-CM | POA: Diagnosis not present

## 2019-07-04 DIAGNOSIS — I10 Essential (primary) hypertension: Secondary | ICD-10-CM | POA: Diagnosis not present

## 2019-07-05 DIAGNOSIS — Z5181 Encounter for therapeutic drug level monitoring: Secondary | ICD-10-CM | POA: Diagnosis not present

## 2019-07-05 DIAGNOSIS — M316 Other giant cell arteritis: Secondary | ICD-10-CM | POA: Diagnosis not present

## 2019-07-05 DIAGNOSIS — Z79899 Other long term (current) drug therapy: Secondary | ICD-10-CM | POA: Diagnosis not present

## 2019-07-05 DIAGNOSIS — I776 Arteritis, unspecified: Secondary | ICD-10-CM | POA: Diagnosis not present

## 2019-07-05 DIAGNOSIS — Z86010 Personal history of colonic polyps: Secondary | ICD-10-CM | POA: Diagnosis not present

## 2019-07-09 DIAGNOSIS — I776 Arteritis, unspecified: Secondary | ICD-10-CM | POA: Diagnosis not present

## 2019-07-09 DIAGNOSIS — N179 Acute kidney failure, unspecified: Secondary | ICD-10-CM | POA: Diagnosis not present

## 2019-07-09 DIAGNOSIS — M488X6 Other specified spondylopathies, lumbar region: Secondary | ICD-10-CM | POA: Diagnosis not present

## 2019-07-11 DIAGNOSIS — E538 Deficiency of other specified B group vitamins: Secondary | ICD-10-CM | POA: Diagnosis not present

## 2019-08-14 DIAGNOSIS — I776 Arteritis, unspecified: Secondary | ICD-10-CM | POA: Diagnosis not present

## 2019-08-14 DIAGNOSIS — J320 Chronic maxillary sinusitis: Secondary | ICD-10-CM | POA: Diagnosis not present

## 2019-08-14 DIAGNOSIS — N183 Chronic kidney disease, stage 3 unspecified: Secondary | ICD-10-CM | POA: Diagnosis not present

## 2019-08-21 DIAGNOSIS — E538 Deficiency of other specified B group vitamins: Secondary | ICD-10-CM | POA: Diagnosis not present

## 2019-08-23 DIAGNOSIS — D3A00 Benign carcinoid tumor of unspecified site: Secondary | ICD-10-CM | POA: Diagnosis not present

## 2019-08-23 DIAGNOSIS — R0600 Dyspnea, unspecified: Secondary | ICD-10-CM | POA: Diagnosis not present

## 2019-08-23 DIAGNOSIS — Z79899 Other long term (current) drug therapy: Secondary | ICD-10-CM | POA: Diagnosis not present

## 2019-08-23 DIAGNOSIS — I776 Arteritis, unspecified: Secondary | ICD-10-CM | POA: Diagnosis not present

## 2019-08-26 DIAGNOSIS — L821 Other seborrheic keratosis: Secondary | ICD-10-CM | POA: Diagnosis not present

## 2019-08-26 DIAGNOSIS — Z85828 Personal history of other malignant neoplasm of skin: Secondary | ICD-10-CM | POA: Diagnosis not present

## 2019-08-26 DIAGNOSIS — D485 Neoplasm of uncertain behavior of skin: Secondary | ICD-10-CM | POA: Diagnosis not present

## 2019-08-26 DIAGNOSIS — L570 Actinic keratosis: Secondary | ICD-10-CM | POA: Diagnosis not present

## 2019-08-27 DIAGNOSIS — M488X6 Other specified spondylopathies, lumbar region: Secondary | ICD-10-CM | POA: Diagnosis not present

## 2019-08-27 DIAGNOSIS — I7789 Other specified disorders of arteries and arterioles: Secondary | ICD-10-CM | POA: Diagnosis not present

## 2019-08-27 DIAGNOSIS — I776 Arteritis, unspecified: Secondary | ICD-10-CM | POA: Diagnosis not present

## 2019-08-27 DIAGNOSIS — N179 Acute kidney failure, unspecified: Secondary | ICD-10-CM | POA: Diagnosis not present

## 2019-08-27 DIAGNOSIS — Z5181 Encounter for therapeutic drug level monitoring: Secondary | ICD-10-CM | POA: Diagnosis not present

## 2019-08-27 DIAGNOSIS — Z79899 Other long term (current) drug therapy: Secondary | ICD-10-CM | POA: Diagnosis not present

## 2019-08-29 DIAGNOSIS — C44329 Squamous cell carcinoma of skin of other parts of face: Secondary | ICD-10-CM | POA: Diagnosis not present

## 2019-08-29 DIAGNOSIS — C44629 Squamous cell carcinoma of skin of left upper limb, including shoulder: Secondary | ICD-10-CM | POA: Diagnosis not present

## 2019-09-02 ENCOUNTER — Encounter: Payer: Self-pay | Admitting: Cardiology

## 2019-09-02 ENCOUNTER — Ambulatory Visit: Payer: Medicare Other | Admitting: Cardiology

## 2019-09-02 VITALS — BP 118/70 | HR 72 | Ht 71.0 in | Wt 202.8 lb

## 2019-09-02 DIAGNOSIS — R0789 Other chest pain: Secondary | ICD-10-CM

## 2019-09-02 DIAGNOSIS — I10 Essential (primary) hypertension: Secondary | ICD-10-CM

## 2019-09-02 DIAGNOSIS — I2510 Atherosclerotic heart disease of native coronary artery without angina pectoris: Secondary | ICD-10-CM | POA: Diagnosis not present

## 2019-09-02 NOTE — Progress Notes (Signed)
Clinical Summary Mr. Krider is a 79 y.o.male seen today for follow up of the following medical problems.  1. Chest pain - 10/2016 nuclear stress: moderate inferior ischemia, LVEF 54%, intermediate risk -11/2016 cath as reported below, essentially mild main vessel disease and more significant small vessel disease not amenable to revascularization. - no chest pain. No SOB/DOE - compliant with meds. He has statin allergy   - ER visit 06/2018 to [facility] with chest pain. ER notes describes as constant x 2 days pain that was reproducible. D-dimer was elevated, CT PE was negative. Trop neg x 2, EKG sinus tach with occasional PVCs - treated with NSAIDs with improvement   07/2018 echo LVEF 78-46%, grade I diastolic dysfunction  - some recent chest pain.  - sharp pains left lower rib. Chronic x 3 years unchanged   2. HTN -he is compliant withmeds  3. 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    Past Medical History:  Diagnosis Date  . Fatigue   . High cholesterol   . Hypertension   . Shortness of breath      Allergies  Allergen Reactions  . Statins Other (See Comments)    Muscle aches.     Current Outpatient Medications  Medication Sig Dispense Refill  . aspirin EC 81 MG tablet Take 1 tablet (81 mg total) by mouth at bedtime.    . Cholecalciferol (VITAMIN D3 PO) Take 1 tablet by mouth daily.    . cyanocobalamin (,VITAMIN B-12,) 1000 MCG/ML injection Inject 1,000 mcg into the muscle every 30 (thirty) days.    Marland Kitchen dutasteride (AVODART) 0.5 MG capsule Take 0.5 mg by mouth daily at 3 pm.     . fenofibrate 160 MG tablet Take 160 mg by mouth daily.    Marland Kitchen FLUoxetine  (PROZAC) 20 MG capsule Take 1 capsule by mouth daily.    . fluticasone (FLONASE) 50 MCG/ACT nasal spray Place 2 sprays into both nostrils daily as needed for allergies or rhinitis.    Marland Kitchen HYDROcodone-acetaminophen (NORCO) 10-325 MG tablet Take 1 tablet by mouth every 6 (six) hours as needed (for pain).     Marland Kitchen losartan (COZAAR) 50 MG tablet Take 50 mg by mouth daily at 3 pm.     . pantoprazole (PROTONIX) 40 MG tablet Take 1 tablet (40 mg total) by mouth daily before breakfast. 30 tablet 5  . tamsulosin (FLOMAX) 0.4 MG CAPS capsule Take 1 capsule by mouth daily.    Marland Kitchen zolpidem (AMBIEN) 10 MG tablet Take 10 mg by mouth at bedtime.     No current facility-administered medications for this visit.     Past Surgical History:  Procedure Laterality Date  . CHOLECYSTECTOMY    . COLONOSCOPY N/A 01/22/2018   Procedure: COLONOSCOPY;  Surgeon: Rogene Houston, MD;  [facility];  Service: Endoscopy;  Laterality: N/A;  . ESOPHAGEAL DILATION N/A 12/10/2015   Procedure: ESOPHAGEAL DILATION;  Surgeon: Rogene Houston, MD;  [facility];  Service: Endoscopy;  Laterality: N/A;  . ESOPHAGEAL DILATION N/A 01/22/2018   Procedure: ESOPHAGEAL DILATION;  Surgeon: Rogene Houston, MD;  [facility]: [facility];  Service: Endoscopy;  Laterality: N/A;  . ESOPHAGOGASTRODUODENOSCOPY N/A 12/10/2015   Procedure: ESOPHAGOGASTRODUODENOSCOPY (EGD);  Surgeon: Rogene Houston, MD;  [facility]: [facility];  Service: Endoscopy;  Laterality: N/A;  . ESOPHAGOGASTRODUODENOSCOPY N/A 01/22/2018   Procedure: ESOPHAGOGASTRODUODENOSCOPY (EGD);  Surgeon: Rogene Houston, MD;  [facility];  Service: Endoscopy;  Laterality: N/A;  12:00-rescheduled 11/4 @ 8:15am per Lelon Frohlich  . LEFT HEART CATH AND CORONARY ANGIOGRAPHY N/A 12/07/2016   Procedure: LEFT HEART CATH AND CORONARY ANGIOGRAPHY;  Surgeon: Jettie Booze, MD;  Location: [place] [facility];  Service: Cardiovascular;  Laterality: N/A;  . POLYPECTOMY   01/22/2018   Procedure: POLYPECTOMY;  Surgeon: Rogene Houston, MD;  Location: [facility];  Service: Endoscopy;;  colon  . REPLACEMENT TOTAL KNEE     rt knee      Allergies  Allergen Reactions  . Statins Other (See Comments)    Muscle aches.      Family History  Problem Relation Age of Onset  . Heart attack Mother   . Hypercholesterolemia Mother   . Heart attack Father   . Hypercholesterolemia Sister   . CAD Sister      Social History Mr. Farace reports that he has never smoked. His smokeless tobacco use includes chew. Mr. Creelman reports no history of alcohol use.   Review of Systems CONSTITUTIONAL: No weight loss, fever, chills, weakness or fatigue.  HEENT: Eyes: No visual loss, blurred vision, double vision or yellow sclerae.No hearing loss, sneezing, congestion, runny nose or sore throat.  SKIN: No rash or itching.  CARDIOVASCULAR: per hpi RESPIRATORY: No shortness of breath, cough or sputum.  GASTROINTESTINAL: No anorexia, nausea, vomiting or diarrhea. No abdominal pain or blood.  GENITOURINARY: No burning on urination, no polyuria NEUROLOGICAL: No headache, dizziness, syncope, paralysis, ataxia, numbness or tingling in the extremities. No change in bowel or bladder control.  MUSCULOSKELETAL: No muscle, back pain, joint pain or stiffness.  LYMPHATICS: No enlarged nodes. No history of splenectomy.  PSYCHIATRIC: No history of depression or anxiety.  ENDOCRINOLOGIC: No reports of sweating, cold or heat intolerance. No polyuria or polydipsia.  Marland Kitchen   Physical Examination Today's Vitals   09/02/19 1359  BP: 118/70  Pulse: 72  SpO2: 94%  Weight: 202 lb 12.8 oz (92 kg)  Height: 5\' 11"  (1.803 m)   Body mass index is 28.28 kg/m.  Gen: resting comfortably, no acute distress HEENT: no scleral icterus, pupils equal round and reactive, no palptable cervical adenopathy,  CV: RRR, no m/r/g, no jvd Resp: Clear to auscultation bilaterally GI: abdomen is soft,  non-tender, non-distended, normal bowel sounds, no hepatosplenomegaly MSK: extremities are warm, no edema.  Skin: warm, no rash Neuro:  no focal deficits Psych: appropriate affect   Diagnostic Studies  11/2016 cath  Mid LAD-2 lesion, 25 %stenosed.  Mid LAD-1 lesion, 25 %stenosed.  Ost 2nd Diag to 2nd Diag lesion, 25 %stenosed.  Ramus lesion, 25 %stenosed.  Dist Cx lesion, 70 %stenosed.  Ost LM to LM lesion, 20 %stenosed.  Ost RCA lesion, 25 %stenosed.  Prox RCA lesion, 30 %stenosed.  Dist RCA lesion, 30 %stenosed.  RPDA lesion, 80 %stenosed.  The left ventricular systolic function is normal.  LV end diastolic pressure is normal.  The left ventricular ejection fraction is 50-55% by visual estimate.  There is no aortic valve stenosis.  Mild disease in the main vessels. He does have significant small vessel disease including distal circumflex. This vessel would be too small to intervene upon. There is also  a significant lesion of the ostial PDA. The posterior lateral artery is a larger Jarick Harkins and larger territory supplied, and intervention of the PDA with compromise posterior lateral artery. Would recommend medical therapy for potential anginal symptoms.  Continue aggressive risk factor modification including lipid-lowering therapy.   10/2016 nuclear stress: moderate inferior ischemia, LVEF 54%, intermediate risk  07/2018 echo 1. The left ventricle has normal systolic function with an ejection fraction of 60-65%. The cavity size was normal. Left ventricular diastolic Doppler parameters are consistent with impaired relaxation. 2. The right ventricle has normal systolic function. The cavity was normal. There is no increase in right ventricular wall thickness. 3. The aortic root is normal in size and structure.   04/2019 Echo PCP Summary 1. The left ventricle is normal in size with normal wall thickness. 2. The left ventricular systolic function is normal, LVEF  is visually estimated at 55-60%. 3. There is mild mitral valve regurgitation. 4. The left atrium is mildly dilated in size. 5. The right ventricle is normal in size, with normal systolic function.    Assessment and Plan  1. Chest pain/CAD -cath with small vessel disease not amenable to revasc -chronic atypical chest pain more left lower rib cage, not consistent with cardiac chest pain - continue to monitor.  - EKG today shows SR, no ischemic changes  2.HTN - at goal, continue current meds   3. Hyperlipidemia - deferred to pcp who has the history regarding his statin intolerance.      Arnoldo Lenis, M.D.

## 2019-09-02 NOTE — Patient Instructions (Signed)
Your physician recommends that you schedule a follow-up appointment in: AS NEEDED WITH DR BRANCH  Your physician recommends that you continue on your current medications as directed. Please refer to the Current Medication list given to you today.  Thank you for choosing  [facility]!!    

## 2019-09-03 DIAGNOSIS — L0100 Impetigo, unspecified: Secondary | ICD-10-CM | POA: Diagnosis not present

## 2019-09-09 DIAGNOSIS — K219 Gastro-esophageal reflux disease without esophagitis: Secondary | ICD-10-CM | POA: Diagnosis not present

## 2019-09-09 DIAGNOSIS — N183 Chronic kidney disease, stage 3 unspecified: Secondary | ICD-10-CM | POA: Diagnosis not present

## 2019-09-09 DIAGNOSIS — R5382 Chronic fatigue, unspecified: Secondary | ICD-10-CM | POA: Diagnosis not present

## 2019-09-09 DIAGNOSIS — I10 Essential (primary) hypertension: Secondary | ICD-10-CM | POA: Diagnosis not present

## 2019-11-18 ENCOUNTER — Other Ambulatory Visit: Payer: Self-pay | Admitting: Urology

## 2020-01-01 IMAGING — CT CT HEAD WITHOUT CONTRAST
3 series · 16 of 47 positions shown, 19 images · non-contrast
Comparison: None.

CLINICAL DATA: Shortness of breath and productive cough

EXAM:
CT HEAD WITHOUT CONTRAST
TECHNIQUE: Contiguous axial images were obtained from the base of the skull
through the vertex without intravenous contrast.

[Series 6: head 5.0 h30s · axial · 0.46mm/px · z∈[-82,+63]mm · 10 of 35 slices shown, 13 images]
[im 3/35  brain]
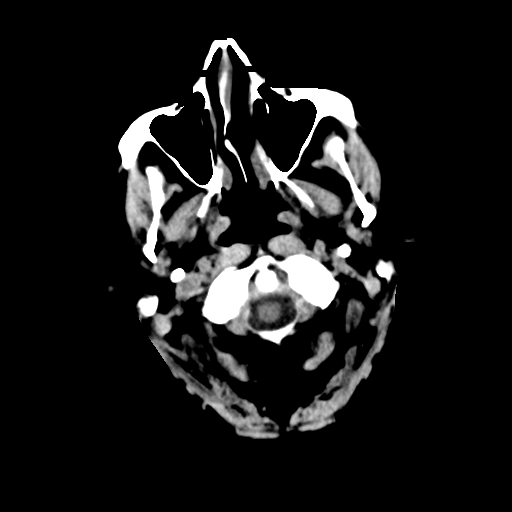
[im 3/35  bone]
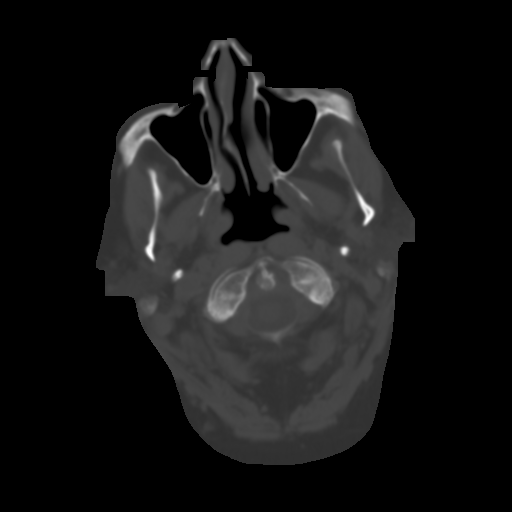
[im 6/35  brain]
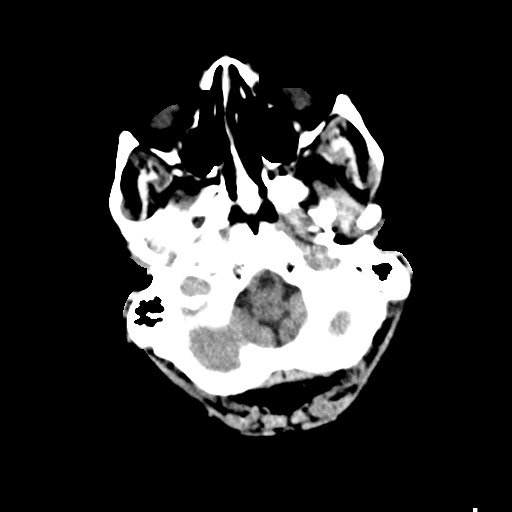
[im 10/35  brain]
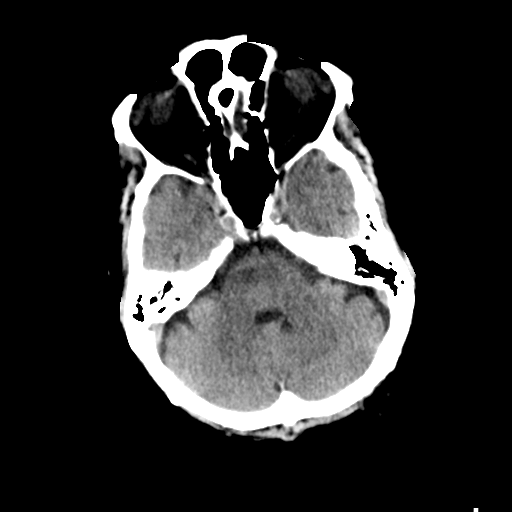
[im 12/35  brain]
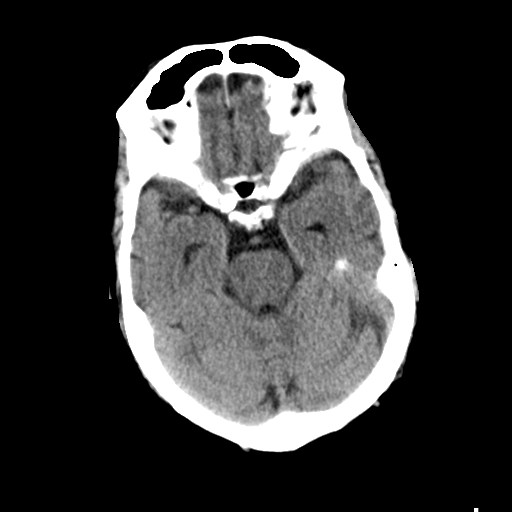
[im 16/35  brain]
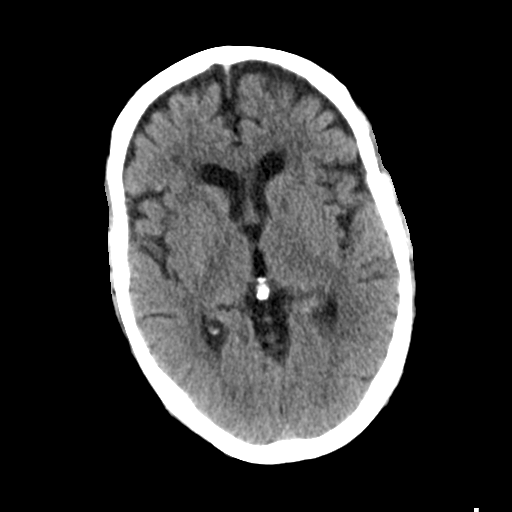
[im 16/35  bone]
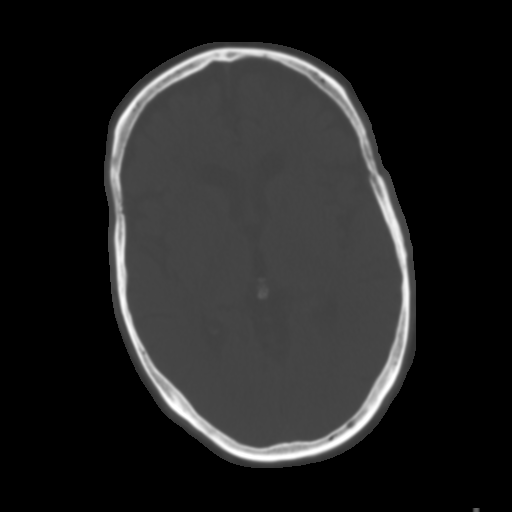
[im 19/35  brain]
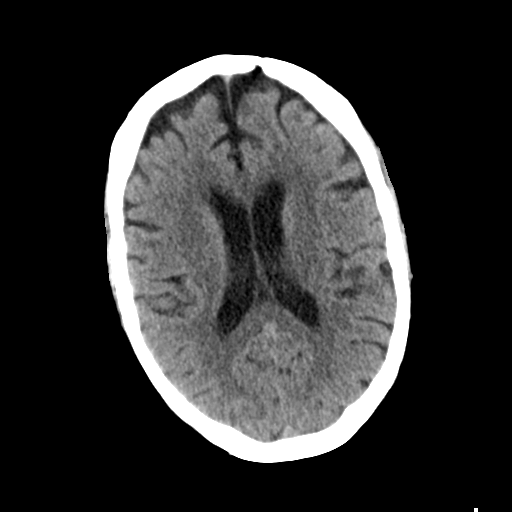
[im 23/35  brain]
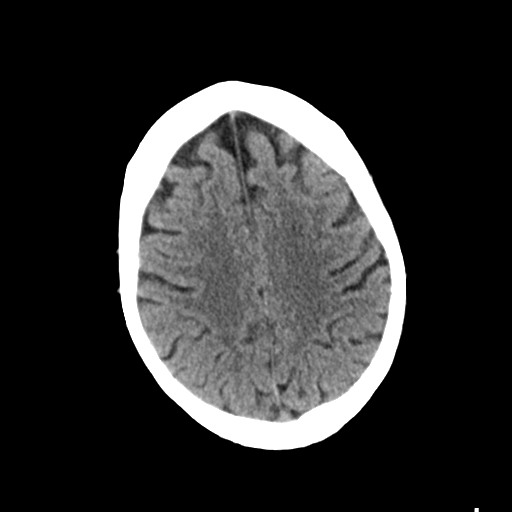
[im 26/35  brain]
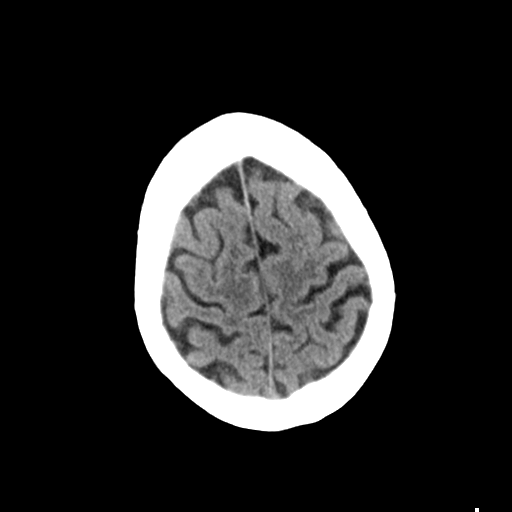
[im 29/35  brain]
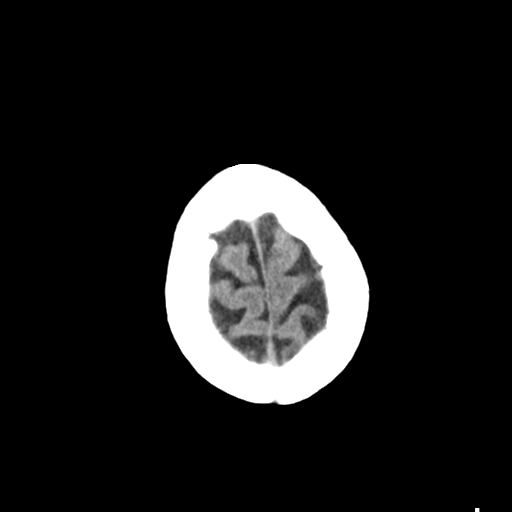
[im 29/35  bone]
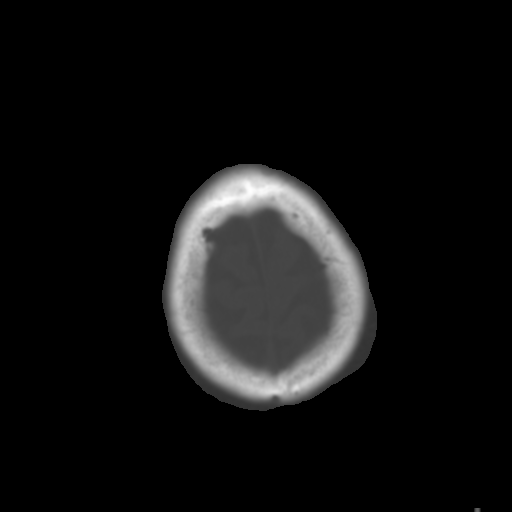
[im 32/35  brain]
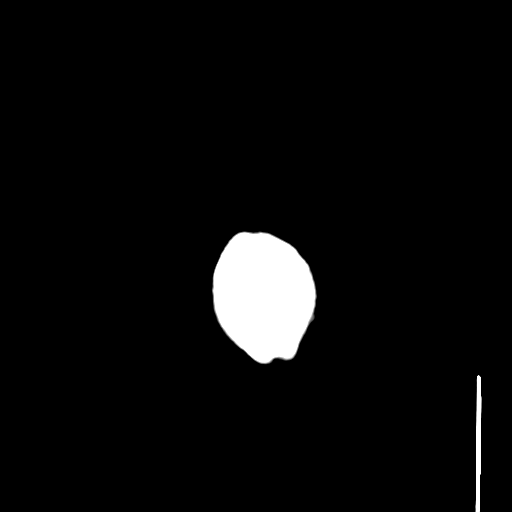

[Series 8: head 3.0 mpr cor · coronal · 0.36mm/px · 3 of 73 slices shown]
[im 25/73  brain]
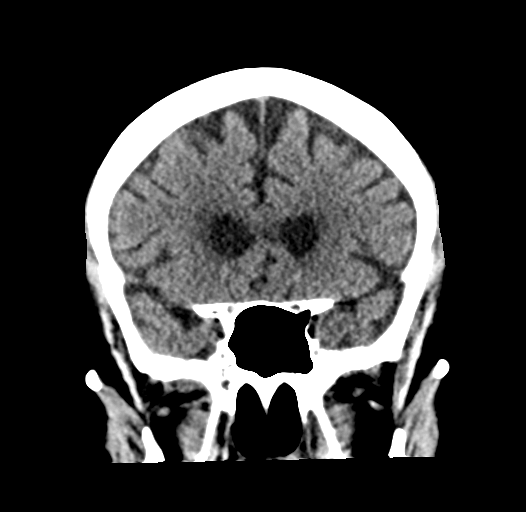
[im 33/73  brain]
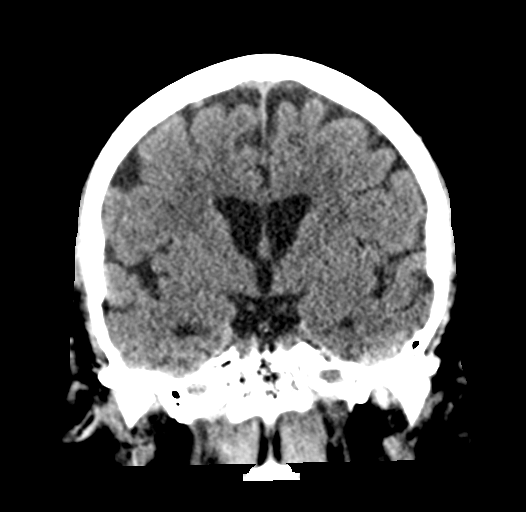
[im 41/73  brain]
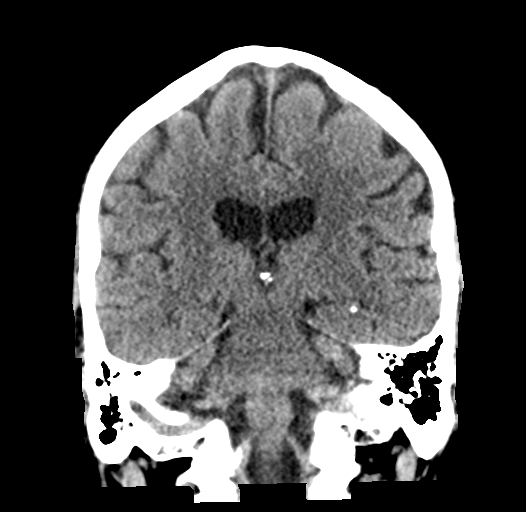

[Series 9: head 3.0 mpr sag · sagittal · 0.33mm/px · 3 of 63 slices shown]
[im 21/63  brain]
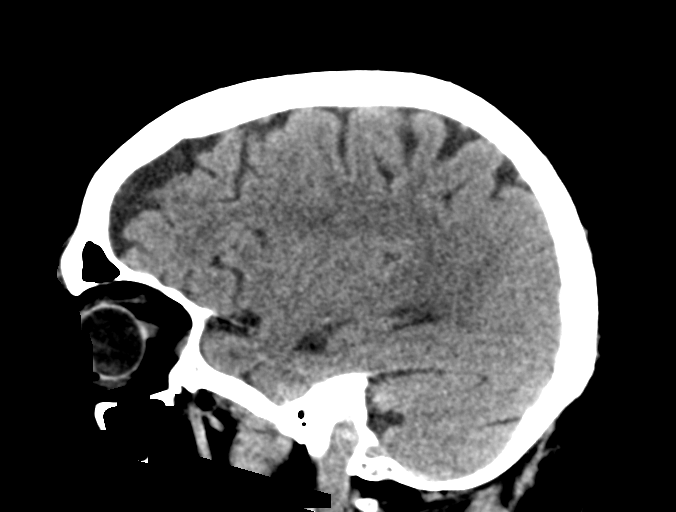
[im 32/63  brain]
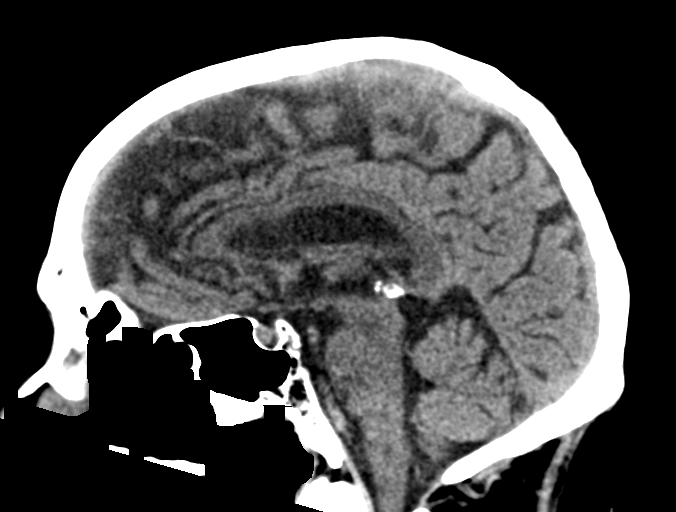
[im 42/63  brain]
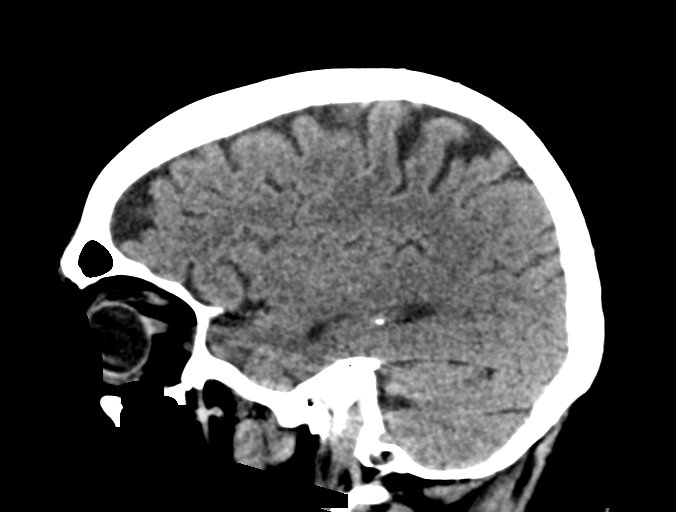

[16 of 47 positions shown; findings below may reference images not displayed]

FINDINGS: Brain: Mild atrophic changes are noted. No findings to suggest acute
hemorrhage, acute infarction or space-occupying mass lesion are
seen.

Vascular: No hyperdense vessel or unexpected calcification.

Skull: Normal. Negative for fracture or focal lesion.

Sinuses/Orbits: No acute finding.

Other: None.
IMPRESSION: Atrophic changes without acute abnormality.

## 2020-01-01 IMAGING — CT CT ABDOMEN AND PELVIS WITH CONTRAST
3 of 14 series · 12 of 46 positions shown, 18 images · IV contrast (omnipaque)
Comparison: None.

CLINICAL DATA: Productive cough and shortness of breath

EXAM:
CT ANGIOGRAPHY CHEST
CT ABDOMEN AND PELVIS WITH CONTRAST
TECHNIQUE: Multidetector CT imaging of the chest was performed using the
standard protocol during bolus administration of intravenous
contrast. Multiplanar CT image reconstructions and MIPs were
obtained to evaluate the vascular anatomy. Multidetector CT imaging
of the abdomen and pelvis was performed using the standard protocol
during bolus administration of intravenous contrast.
CONTRAST:  80mL OMNIPAQUE IOHEXOL 300 MG/ML  SOLN

[Series 8: head 3.0 mpr cor · coronal · 0.36mm/px · 1 of 73 slices shown, 2 images]
[im 37/73  soft-tissue]
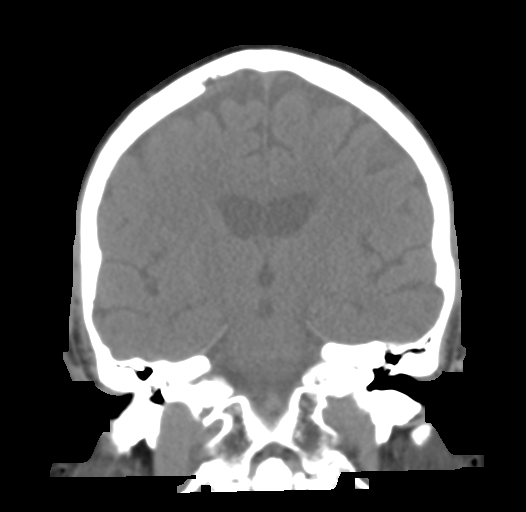
[im 37/73  bone]
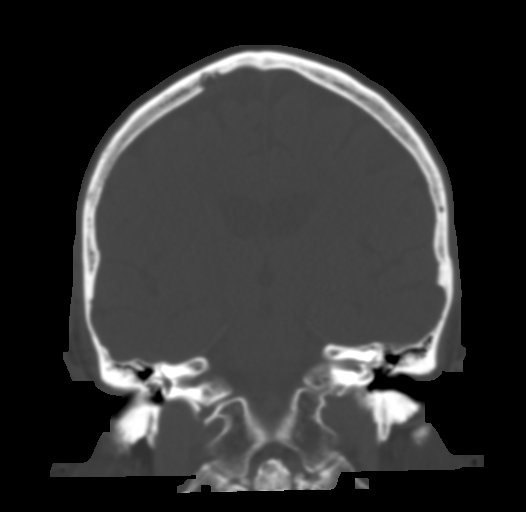

[Series 11: thins · axial · 0.79mm/px · z∈[-464,-260]mm · 7 of 287 slices shown]
[im 21/287  soft-tissue]
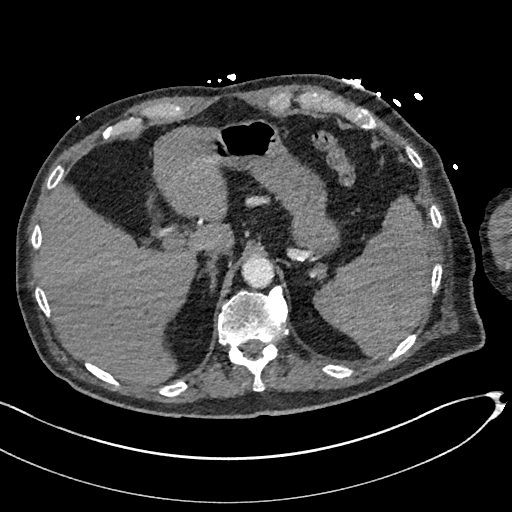
[im 62/287  soft-tissue]
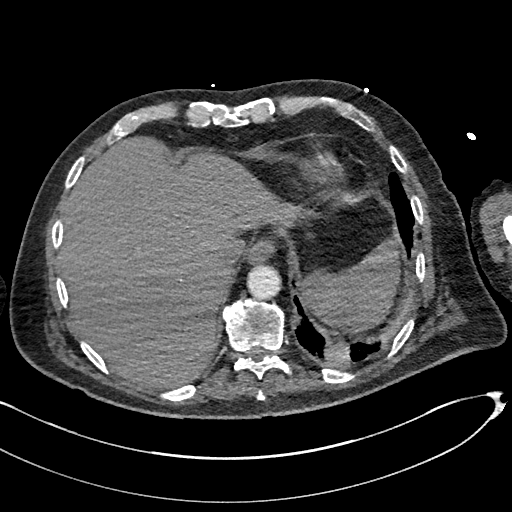
[im 103/287  soft-tissue]
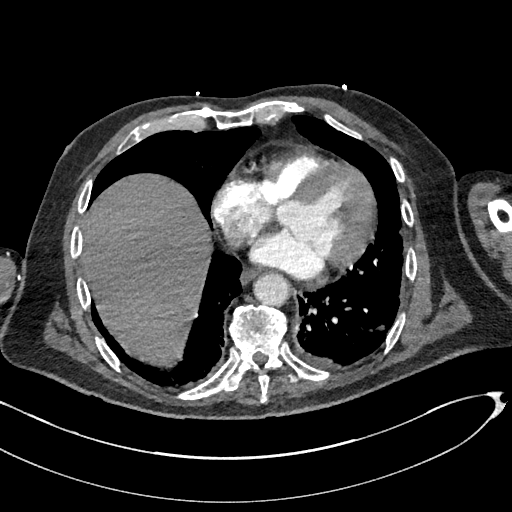
[im 123/287  soft-tissue]
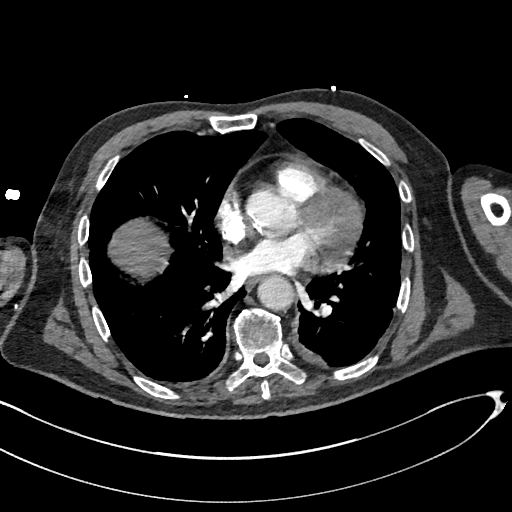
[im 164/287  soft-tissue]
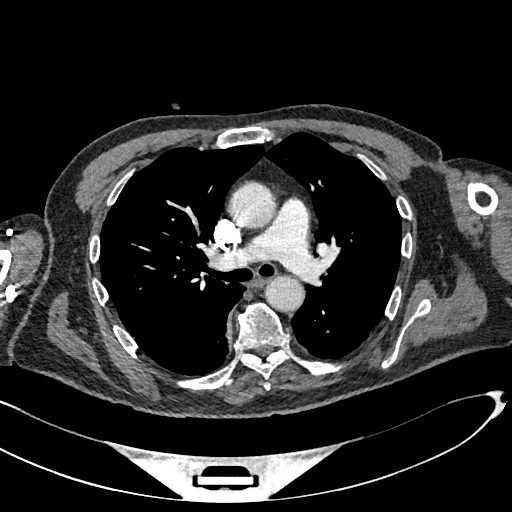
[im 184/287  soft-tissue]
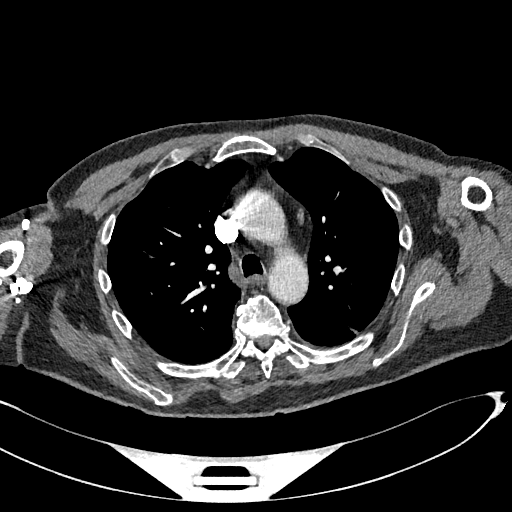
[im 225/287  soft-tissue]
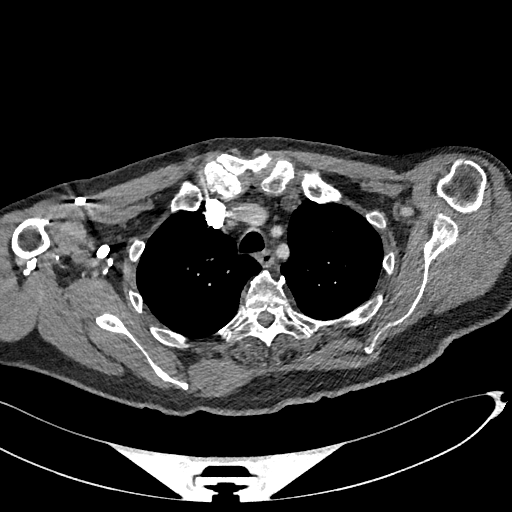

[Series 17: abd/ pelvis 5.0 i30f 1 · axial · 0.93mm/px · z∈[-802,-457]mm · 4 of 115 slices shown, 9 images]
[im 23/115  soft-tissue]
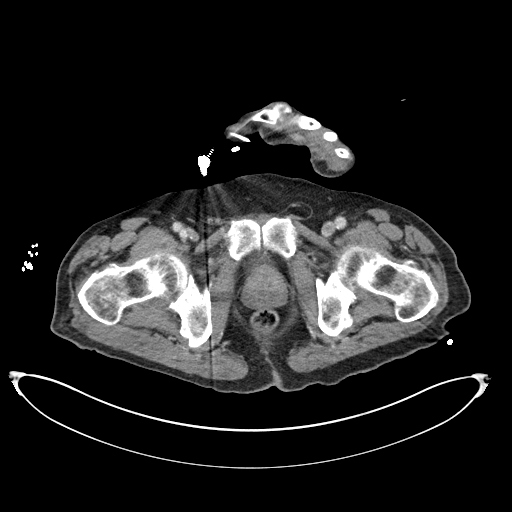
[im 23/115  lung]
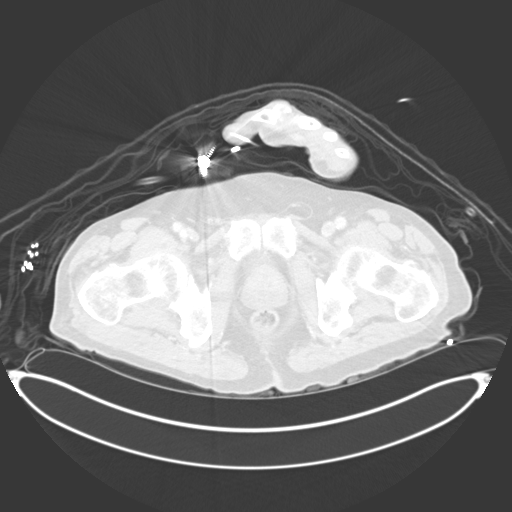
[im 23/115  bone]
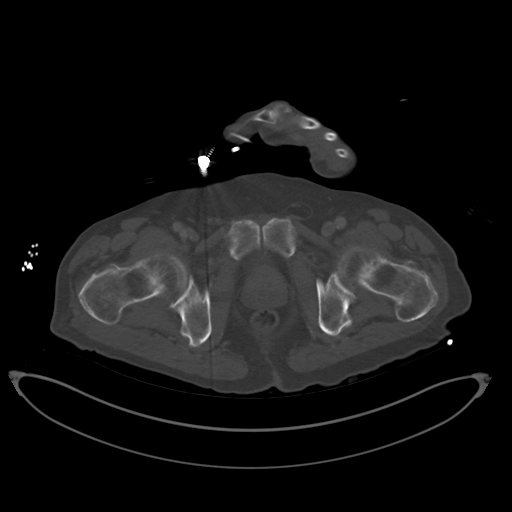
[im 46/115  soft-tissue]
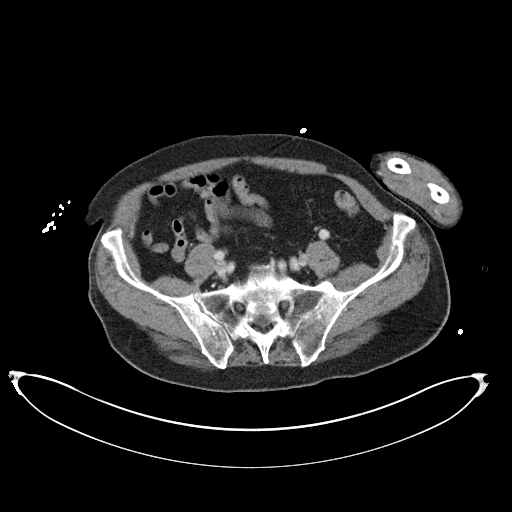
[im 46/115  lung]
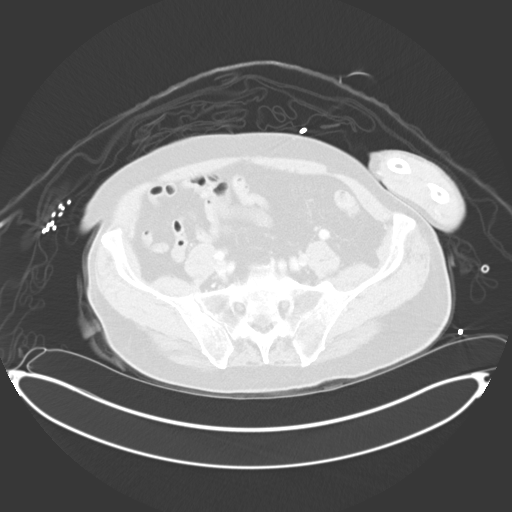
[im 69/115  soft-tissue]
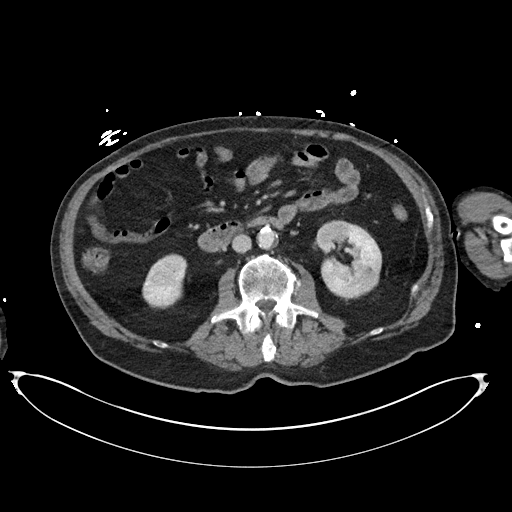
[im 69/115  lung]
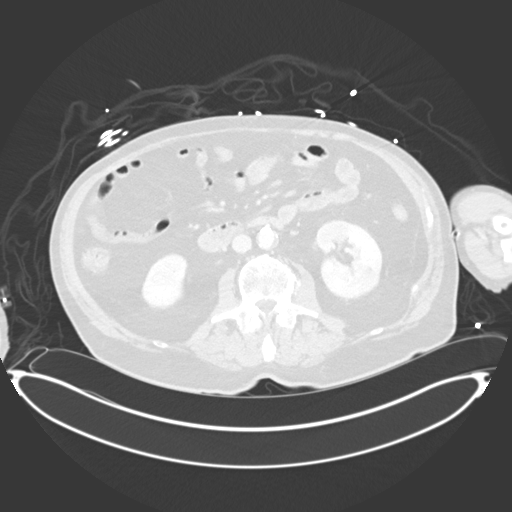
[im 92/115  soft-tissue]
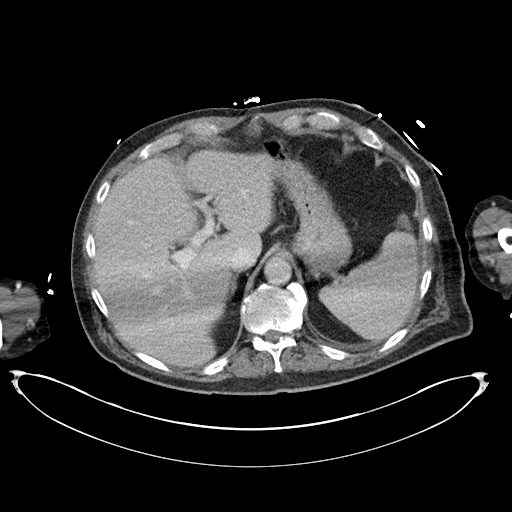
[im 92/115  lung]
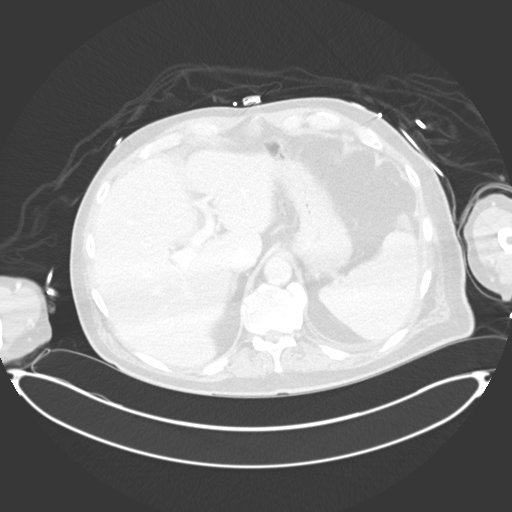

[12 of 46 positions shown; findings below may reference images not displayed]

FINDINGS: CTA CHEST FINDINGS

Cardiovascular: Thoracic aorta demonstrates mild atherosclerotic
calcifications without aneurysmal dilatation or dissection. No
cardiac enlargement is seen. Scattered coronary calcifications are
noted. The pulmonary artery shows no focal infiltrate or sizable
effusion.

Mediastinum/Nodes: Thoracic inlet is within normal limits. No hilar
or mediastinal adenopathy is seen. The esophagus as visualized is
within normal limits.

Lungs/Pleura: Mild left lower lobe atelectatic changes are noted.
The lungs are otherwise within normal limits. No sizable effusion or
pneumothorax is seen.

Musculoskeletal: Degenerative changes of the thoracic spine are
noted.

Review of the MIP images confirms the above findings.

CT ABDOMEN and PELVIS FINDINGS

Hepatobiliary: Fatty infiltration of the liver is noted. The
gallbladder has been surgically removed.

Pancreas: Unremarkable. No pancreatic ductal dilatation or
surrounding inflammatory changes.

Spleen: Normal in size without focal abnormality.

Adrenals/Urinary Tract: Adrenal glands are within normal limits.
Renal cystic changes are noted bilaterally slightly worse on the
left than the right. The largest of these cysts measures 5 cm in
greatest dimension. No definitive renal calculi or obstructive
changes are seen. The ureters are within normal limits. The bladder
is partially distended.

Stomach/Bowel: Scattered diverticular change of the colon is noted.
No obstructive or inflammatory changes are seen. The appendix is not
well appreciated. No inflammatory changes are seen. Small bowel is
within normal limits. Stomach is unremarkable.

Vascular/Lymphatic: Aortic atherosclerosis. No enlarged abdominal or
pelvic lymph nodes.

Reproductive: Prostate is unremarkable.

Other: No abdominal wall hernia or abnormality. No abdominopelvic
ascites.

Musculoskeletal: Degenerative changes of lumbar spine are noted. No
acute bony abnormality is seen.

Review of the MIP images confirms the above findings.
IMPRESSION: CTA of the chest: No evidence of pulmonary emboli.

Mild left lower lobe atelectatic changes.

CT of the abdomen and pelvis: Chronic changes as described above. No
acute abnormality noted.

Aortic Atherosclerosis ([id number]).

## 2020-01-03 IMAGING — CT CT NECK WITH CONTRAST
3 of 4 series · 13 of 33 positions shown, 16 images · IV contrast (Omni 300)
Comparison: None.

CLINICAL DATA: Sore throat, weight loss, tobacco use

EXAM:
CT NECK WITH CONTRAST
TECHNIQUE: Multidetector CT imaging of the neck was performed using the
standard protocol following the bolus administration of intravenous
contrast.
CONTRAST:  80mL OMNIPAQUE IOHEXOL 300 MG/ML  SOLN

[Series 6: neck 2.0 st · sagittal · 0.40mm/px · 5 of 85 slices shown, 6 images (1 of 2)]
[im 29/85  bone]
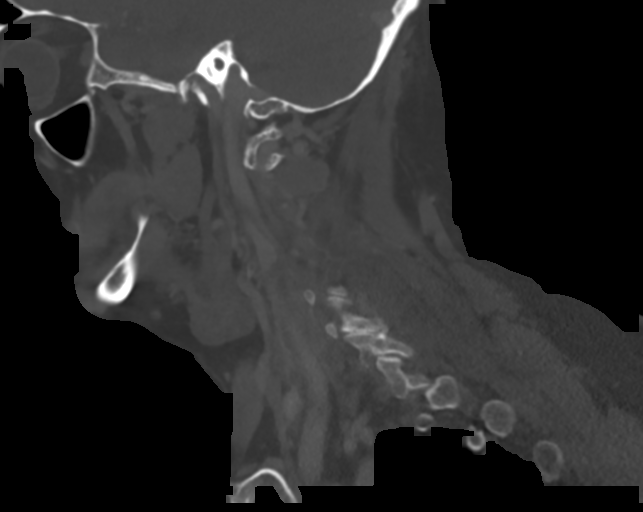
[im 36/85  bone]
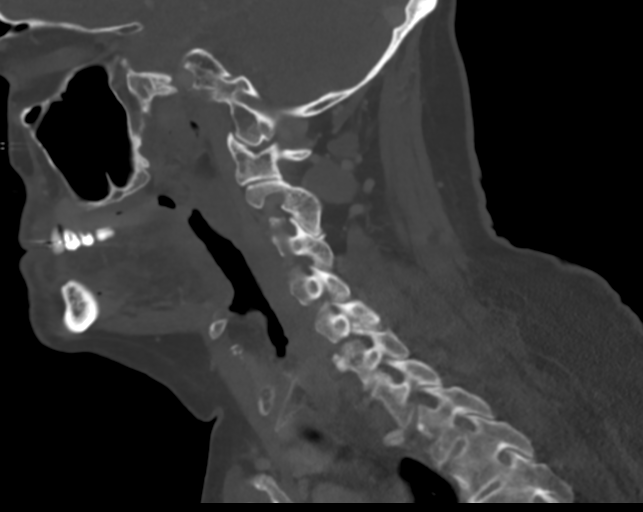
[im 43/85  soft-tissue]
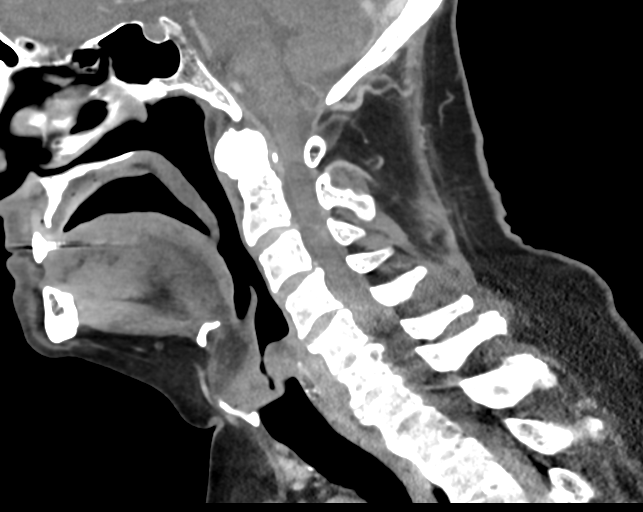
[im 43/85  bone]
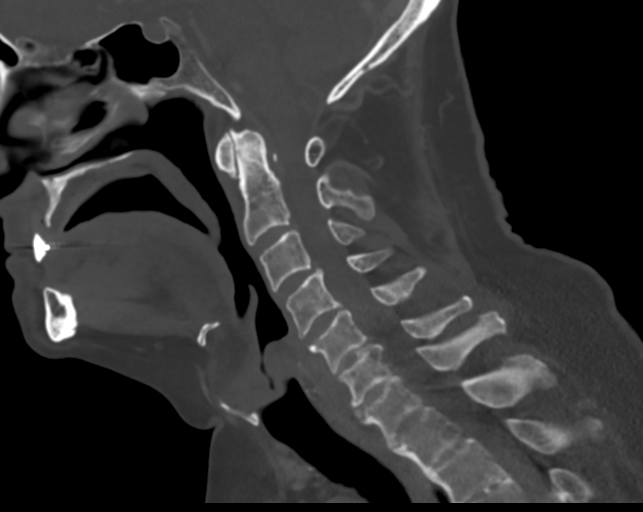
[im 50/85  bone]
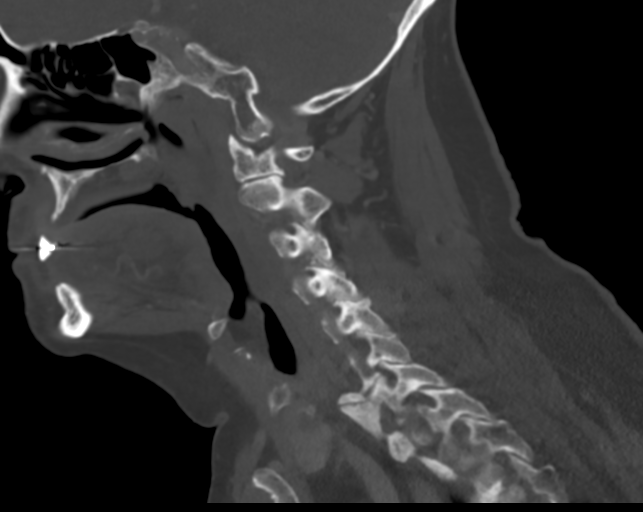
[im 57/85  bone]
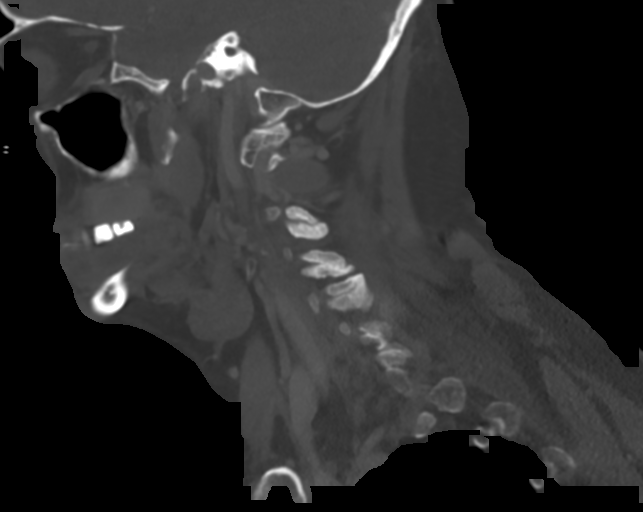

[Series 7: neck 2.0 st · coronal · 0.38mm/px · 3 of 117 slices shown (2 of 2)]
[im 24/117  bone]
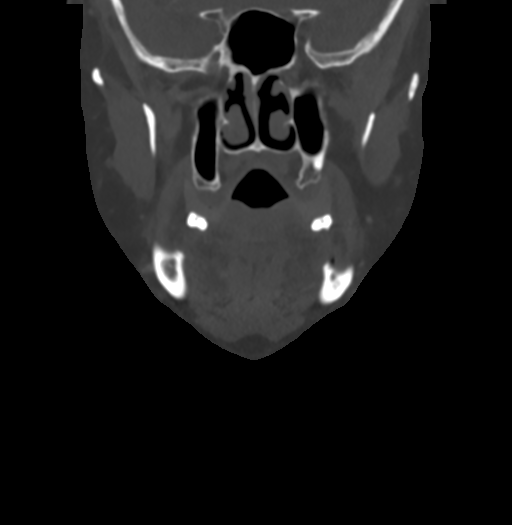
[im 47/117  bone]
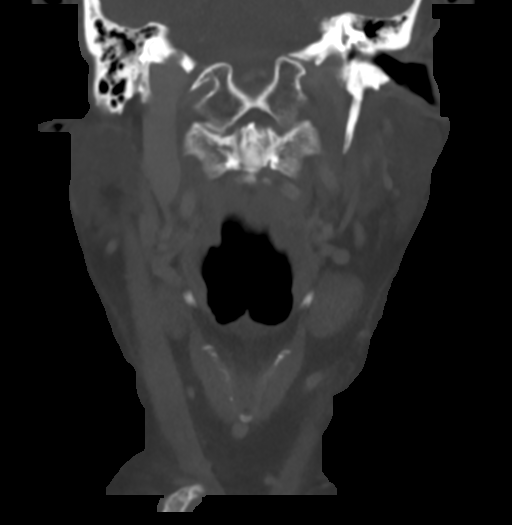
[im 70/117  bone]
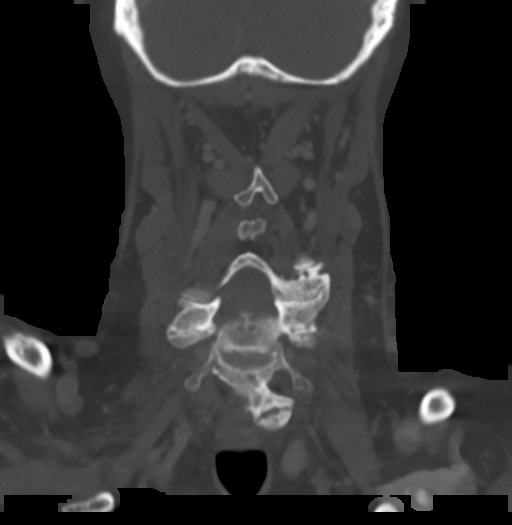

[Series 8: neck 2.0 st orthogonal · axial · 0.34mm/px · z∈[-283,-133]mm · 5 of 116 slices shown, 7 images]
[im 17/116  soft-tissue]
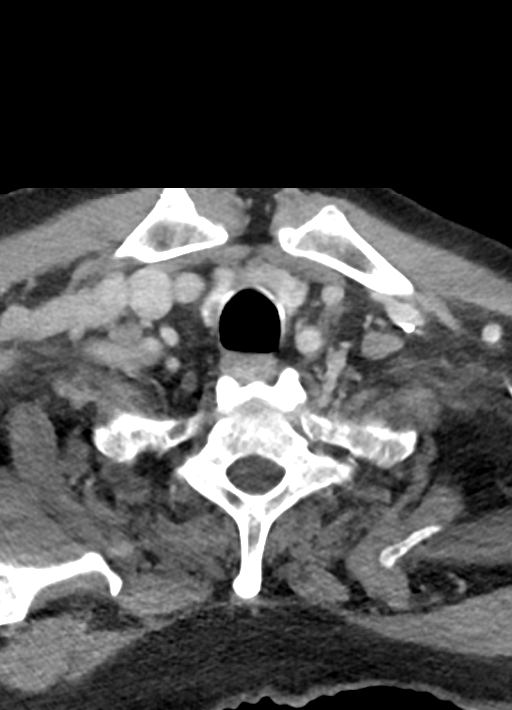
[im 17/116  bone]
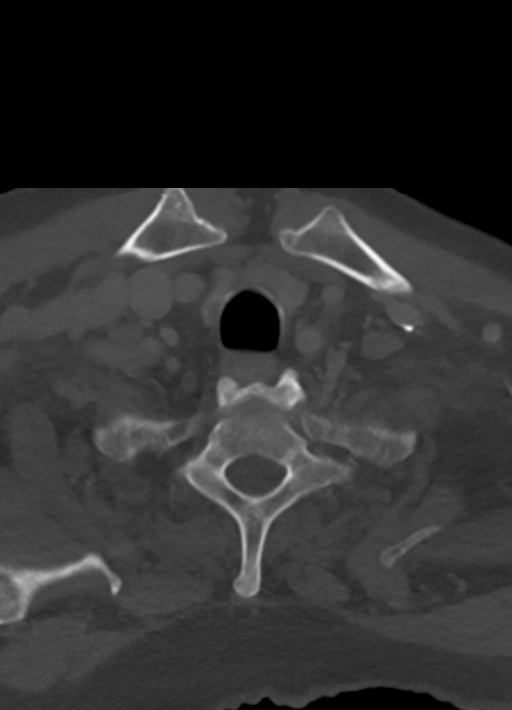
[im 33/116  bone]
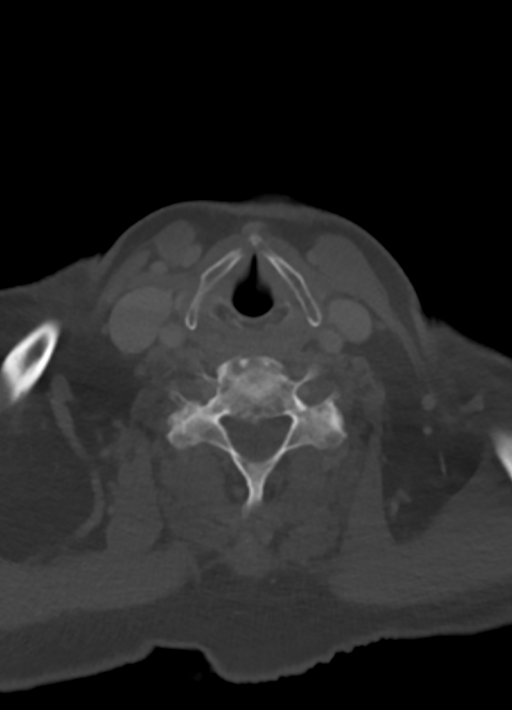
[im 66/116  bone]
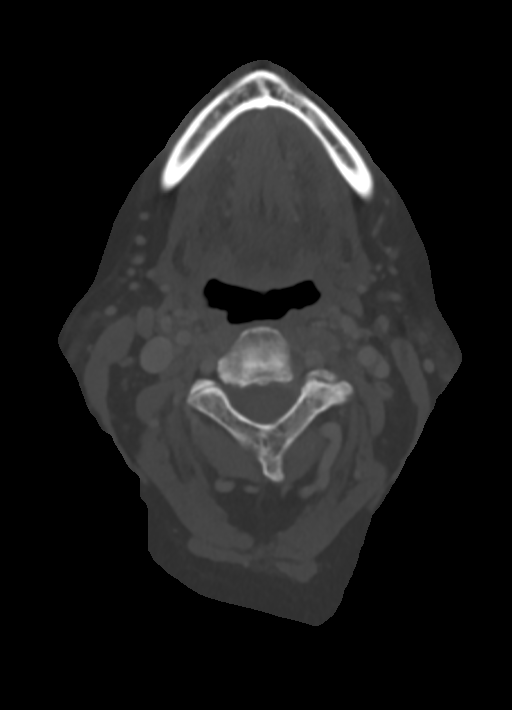
[im 83/116  bone]
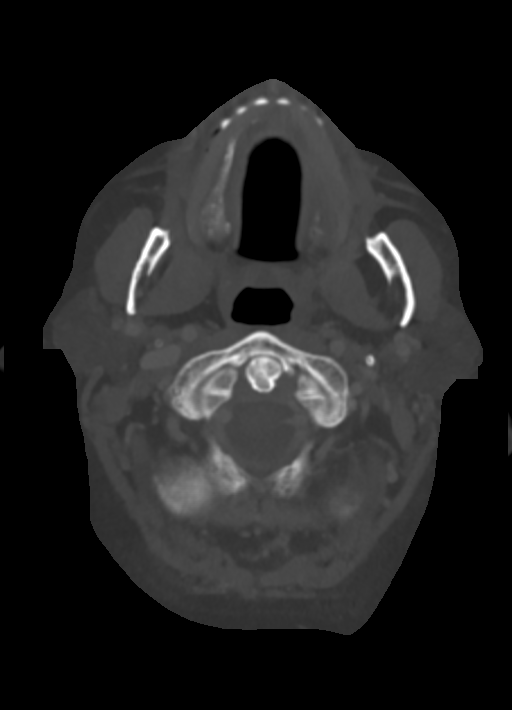
[im 99/116  soft-tissue]
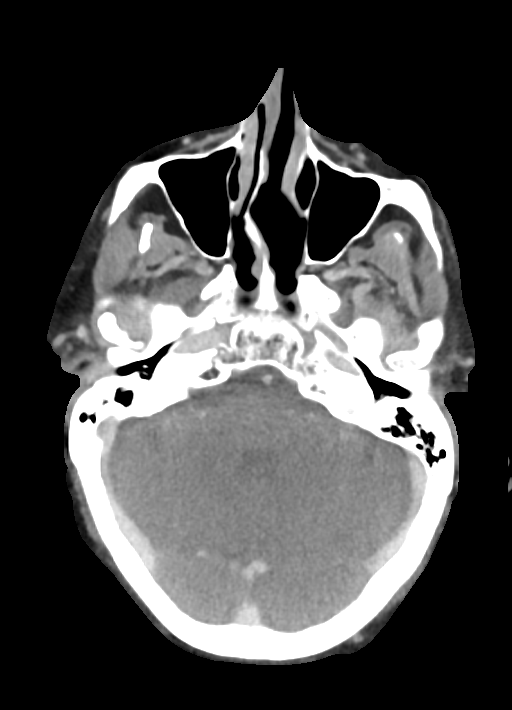
[im 99/116  bone]
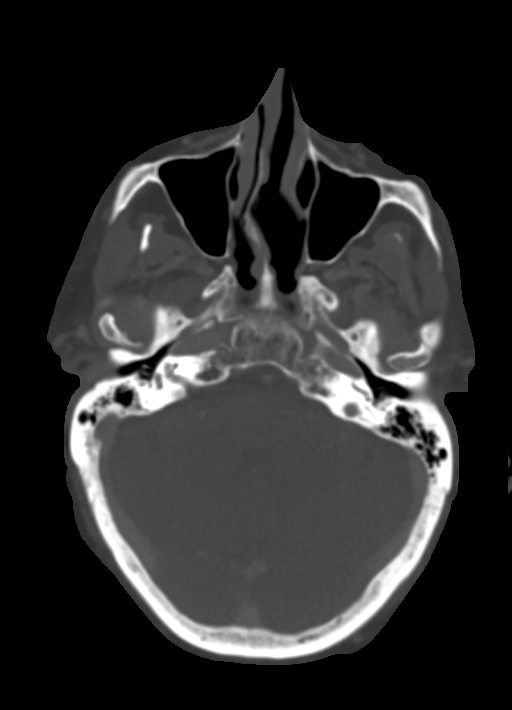

[13 of 33 positions shown; findings below may reference images not displayed]

FINDINGS: Pharynx and larynx: Normal. No mass or swelling.

Salivary glands: No inflammation, mass, or stone.

Thyroid: Negative

Lymph nodes: No enlarged lymph nodes in the neck.

Vascular: Normal vascular enhancement. Mild atherosclerotic disease
carotid bifurcation bilaterally.

Limited intracranial: Negative

Visualized orbits: No orbital lesion.  Bilateral cataract surgery.

Mastoids and visualized paranasal sinuses: Mild mucosal edema
paranasal sinuses.

Skeleton: No acute skeletal abnormality. Disc and facet degeneration
in the cervical spine.

Upper chest: Lung apices clear bilaterally

Other: None
IMPRESSION: Negative for mass or adenopathy in neck.  No acute abnormality.

Mild atherosclerotic disease carotid bifurcation bilaterally.

## 2022-02-07 ENCOUNTER — Encounter (INDEPENDENT_AMBULATORY_CARE_PROVIDER_SITE_OTHER): Payer: Self-pay | Admitting: Gastroenterology

## 2022-08-20 DEATH — deceased
